# Patient Record
Sex: Female | Born: 1950 | Race: White | Hispanic: No | Marital: Married | Smoking: Never smoker
Health system: Southern US, Community
[De-identification: ages and names within clinical notes are randomized; demographics above are authoritative.]

## PROBLEM LIST (undated history)

## (undated) VITALS — BP 102/69 | HR 88 | Temp 97.2°F | Resp 18 | Wt 132.0 lb

## (undated) VITALS — BP 121/78 | HR 67 | Temp 98.2°F | Resp 18 | Wt 144.3 lb

## (undated) DIAGNOSIS — R058 Other specified cough: Secondary | ICD-10-CM

## (undated) DIAGNOSIS — K8020 Calculus of gallbladder without cholecystitis without obstruction: Secondary | ICD-10-CM

## (undated) DIAGNOSIS — E785 Hyperlipidemia, unspecified: Secondary | ICD-10-CM

## (undated) HISTORY — DX: Calculus of gallbladder without cholecystitis without obstruction: K80.20

## (undated) HISTORY — DX: Other specified cough: R05.8

## (undated) HISTORY — DX: Hyperlipidemia, unspecified: E78.5

## (undated) HISTORY — PX: COLONOSCOPY, DIAGNOSTIC (SCREENING): SHX174

---
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
## 1986-09-28 HISTORY — PX: TUBAL LIGATION: SHX77

## 1988-09-28 HISTORY — PX: HYSTERECTOMY: SHX81

## 2013-06-19 ENCOUNTER — Other Ambulatory Visit: Payer: Self-pay

## 2013-06-19 ENCOUNTER — Ambulatory Visit
Admission: RE | Admit: 2013-06-19 | Discharge: 2013-06-19 | Disposition: A | Discharge status: Home / Self Care | Payer: BC Managed Care – PPO | Source: Ambulatory Visit

## 2013-06-19 DIAGNOSIS — R109 Unspecified abdominal pain: Secondary | ICD-10-CM | POA: Insufficient documentation

## 2013-06-19 DIAGNOSIS — K838 Other specified diseases of biliary tract: Secondary | ICD-10-CM | POA: Insufficient documentation

## 2013-06-19 DIAGNOSIS — R7989 Other specified abnormal findings of blood chemistry: Secondary | ICD-10-CM | POA: Insufficient documentation

## 2013-06-19 DIAGNOSIS — R142 Eructation: Secondary | ICD-10-CM | POA: Insufficient documentation

## 2014-11-02 ENCOUNTER — Other Ambulatory Visit (FREE_STANDING_LABORATORY_FACILITY): Payer: No Typology Code available for payment source

## 2014-11-02 DIAGNOSIS — R1011 Right upper quadrant pain: Secondary | ICD-10-CM

## 2014-11-02 LAB — CBC AND DIFFERENTIAL
Basophils Absolute Automated: 0.04 10*3/uL (ref 0.00–0.20)
Basophils Automated: 1 %
Eosinophils Absolute Automated: 0.07 10*3/uL (ref 0.00–0.70)
Eosinophils Automated: 2 %
Hematocrit: 42.3 % (ref 37.0–47.0)
Hgb: 14 g/dL (ref 12.0–16.0)
Immature Granulocytes Absolute: 0 10*3/uL
Immature Granulocytes: 0 %
Lymphocytes Absolute Automated: 1.43 10*3/uL (ref 0.50–4.40)
Lymphocytes Automated: 31 %
MCH: 30.2 pg (ref 28.0–32.0)
MCHC: 33.1 g/dL (ref 32.0–36.0)
MCV: 91.4 fL (ref 80.0–100.0)
MPV: 11.5 fL (ref 9.4–12.3)
Monocytes Absolute Automated: 0.36 10*3/uL (ref 0.00–1.20)
Monocytes: 8 %
Neutrophils Absolute: 2.72 10*3/uL (ref 1.80–8.10)
Neutrophils: 59 %
Nucleated RBC: 0 /100 WBC (ref 0–1)
Platelets: 256 10*3/uL (ref 140–400)
RBC: 4.63 10*6/uL (ref 4.20–5.40)
RDW: 14 % (ref 12–15)
WBC: 4.62 10*3/uL (ref 3.50–10.80)

## 2014-11-02 LAB — COMPREHENSIVE METABOLIC PANEL
ALT: 24 U/L (ref 0–55)
AST (SGOT): 20 U/L (ref 5–34)
Albumin/Globulin Ratio: 1.5 (ref 0.9–2.2)
Albumin: 4.2 g/dL (ref 3.5–5.0)
Alkaline Phosphatase: 76 U/L (ref 37–106)
BUN: 9 mg/dL (ref 7.0–19.0)
Bilirubin, Total: 0.5 mg/dL (ref 0.1–1.2)
CO2: 27 mEq/L (ref 21–30)
Calcium: 9.2 mg/dL (ref 8.5–10.5)
Chloride: 105 mEq/L (ref 100–111)
Creatinine: 0.8 mg/dL (ref 0.4–1.5)
Globulin: 2.8 g/dL (ref 2.0–3.7)
Glucose: 83 mg/dL (ref 70–100)
Potassium: 4.6 mEq/L (ref 3.5–5.3)
Protein, Total: 7 g/dL (ref 6.0–8.3)
Sodium: 141 mEq/L (ref 135–146)

## 2014-11-02 LAB — GFR: EGFR: >60

## 2014-11-02 LAB — HEMOLYSIS INDEX: Hemolysis Index: 7 (ref 0–18)

## 2014-11-02 LAB — AMYLASE: Amylase: 66 U/L (ref 25–125)

## 2014-11-02 LAB — LIPASE: Lipase: 28 U/L (ref 8–78)

## 2014-11-07 ENCOUNTER — Other Ambulatory Visit: Payer: Self-pay | Admitting: Gastroenterology

## 2014-11-07 DIAGNOSIS — K8021 Calculus of gallbladder without cholecystitis with obstruction: Secondary | ICD-10-CM

## 2014-11-13 ENCOUNTER — Ambulatory Visit
Admission: RE | Admit: 2014-11-13 | Discharge: 2014-11-13 | Disposition: A | Discharge status: Home / Self Care | Payer: No Typology Code available for payment source | Source: Ambulatory Visit | Attending: Gastroenterology | Admitting: Gastroenterology

## 2014-11-13 DIAGNOSIS — R1011 Right upper quadrant pain: Secondary | ICD-10-CM | POA: Insufficient documentation

## 2014-11-13 DIAGNOSIS — K760 Fatty (change of) liver, not elsewhere classified: Secondary | ICD-10-CM | POA: Insufficient documentation

## 2014-11-13 DIAGNOSIS — K8080 Other cholelithiasis without obstruction: Secondary | ICD-10-CM | POA: Insufficient documentation

## 2014-11-13 DIAGNOSIS — K8021 Calculus of gallbladder without cholecystitis with obstruction: Secondary | ICD-10-CM

## 2014-11-13 DIAGNOSIS — R160 Hepatomegaly, not elsewhere classified: Secondary | ICD-10-CM | POA: Insufficient documentation

## 2014-11-13 MED ORDER — GADOBUTROL 1 MMOL/ML IV SOLN
7.0000 mL | Freq: Once | INTRAVENOUS | Status: AC | PRN
Start: 2014-11-13 — End: 2014-11-13
  Administered 2014-11-13: 7 mmol via INTRAVENOUS

## 2014-12-06 ENCOUNTER — Ambulatory Visit: Payer: No Typology Code available for payment source

## 2014-12-10 ENCOUNTER — Ambulatory Visit: Payer: No Typology Code available for payment source | Admitting: Radiology

## 2014-12-10 ENCOUNTER — Ambulatory Visit
Admission: RE | Admit: 2014-12-10 | Discharge: 2014-12-10 | Disposition: A | Discharge status: Home / Self Care | Payer: No Typology Code available for payment source | Source: Ambulatory Visit | Attending: Surgery | Admitting: Surgery

## 2014-12-10 ENCOUNTER — Ambulatory Visit: Payer: No Typology Code available for payment source

## 2014-12-10 ENCOUNTER — Encounter
Admission: RE | Disposition: A | Discharge status: Home / Self Care | Payer: Self-pay | Source: Ambulatory Visit | Attending: Surgery

## 2014-12-10 ENCOUNTER — Ambulatory Visit: Payer: Self-pay

## 2014-12-10 ENCOUNTER — Ambulatory Visit: Payer: No Typology Code available for payment source | Admitting: Surgery

## 2014-12-10 DIAGNOSIS — K8010 Calculus of gallbladder with chronic cholecystitis without obstruction: Secondary | ICD-10-CM | POA: Insufficient documentation

## 2014-12-10 DIAGNOSIS — K219 Gastro-esophageal reflux disease without esophagitis: Secondary | ICD-10-CM | POA: Insufficient documentation

## 2014-12-10 DIAGNOSIS — K838 Other specified diseases of biliary tract: Secondary | ICD-10-CM | POA: Insufficient documentation

## 2014-12-10 HISTORY — PX: LAPAROSCOPIC, CHOLECYSTECTOMY, CHOLANGIOGRAM: SHX4475

## 2014-12-10 HISTORY — DX: Calculus of gallbladder without cholecystitis without obstruction: K80.20

## 2014-12-10 SURGERY — LAPAROSCOPIC, CHOLECYSTECTOMY, CHOLANGIOGRAM
Anesthesia: Anesthesia General | Site: Abdomen | Wound class: Clean Contaminated

## 2014-12-10 MED ORDER — ONDANSETRON HCL 4 MG/2ML IJ SOLN
INTRAMUSCULAR | Status: AC
Start: 2014-12-10 — End: ?
  Filled 2014-12-10: qty 2

## 2014-12-10 MED ORDER — LACTATED RINGERS IV SOLN
INTRAVENOUS | Status: DC | PRN
Start: 2014-12-10 — End: 2014-12-10

## 2014-12-10 MED ORDER — NEOSTIGMINE METHYLSULFATE 1 MG/ML IJ SOLN
INTRAMUSCULAR | Status: AC
Start: 2014-12-10 — End: ?
  Filled 2014-12-10: qty 10

## 2014-12-10 MED ORDER — LACTATED RINGERS IV SOLN
INTRAVENOUS | Status: DC
Start: 2014-12-10 — End: 2014-12-10

## 2014-12-10 MED ORDER — IOTHALAMATE MEGLUMINE 60 % IJ SOLN
INTRAMUSCULAR | Status: AC
Start: 2014-12-10 — End: ?
  Filled 2014-12-10: qty 50

## 2014-12-10 MED ORDER — SODIUM CHLORIDE 0.9 % IV SOLN
INTRAVENOUS | Status: AC
Start: 2014-12-10 — End: ?
  Filled 2014-12-10: qty 100

## 2014-12-10 MED ORDER — BUPIVACAINE HCL 0.5 % IJ SOLN
INTRAMUSCULAR | Status: DC | PRN
Start: 2014-12-10 — End: 2014-12-10
  Administered 2014-12-10: 10 mL

## 2014-12-10 MED ORDER — PROPOFOL INFUSION 10 MG/ML
INTRAVENOUS | Status: DC | PRN
Start: 2014-12-10 — End: 2014-12-10
  Administered 2014-12-10: 160 mg via INTRAVENOUS

## 2014-12-10 MED ORDER — EPHEDRINE SULFATE 50 MG/ML IJ SOLN
INTRAMUSCULAR | Status: AC
Start: 2014-12-10 — End: ?
  Filled 2014-12-10: qty 1

## 2014-12-10 MED ORDER — SODIUM CHLORIDE 0.9 % IV MBP
1.0000 g | Freq: Three times a day (TID) | INTRAVENOUS | Status: DC
Start: 2014-12-10 — End: 2014-12-10

## 2014-12-10 MED ORDER — SODIUM CHLORIDE BACTERIOSTATIC 0.9 % IJ SOLN
INTRAMUSCULAR | Status: AC
Start: 2014-12-10 — End: ?
  Filled 2014-12-10: qty 30

## 2014-12-10 MED ORDER — NEOSTIGMINE METHYLSULFATE 1 MG/ML IJ SOLN
INTRAMUSCULAR | Status: DC | PRN
Start: 2014-12-10 — End: 2014-12-10
  Administered 2014-12-10: 3 mg via INTRAVENOUS

## 2014-12-10 MED ORDER — MIDAZOLAM HCL 2 MG/2ML IJ SOLN
INTRAMUSCULAR | Status: DC | PRN
Start: 2014-12-10 — End: 2014-12-10
  Administered 2014-12-10: 2 mg via INTRAVENOUS

## 2014-12-10 MED ORDER — SODIUM CHLORIDE 0.9 % IJ SOLN
INTRAMUSCULAR | Status: AC
Start: 2014-12-10 — End: ?
  Filled 2014-12-10: qty 10

## 2014-12-10 MED ORDER — OXYCODONE-ACETAMINOPHEN 5-325 MG PO TABS
1.0000 | ORAL_TABLET | ORAL | Status: DC | PRN
Start: 2014-12-10 — End: 2018-01-13

## 2014-12-10 MED ORDER — EPHEDRINE SULFATE 50 MG/ML IJ SOLN
INTRAMUSCULAR | Status: DC | PRN
Start: 2014-12-10 — End: 2014-12-10
  Administered 2014-12-10 (×2): 10 mg via INTRAVENOUS

## 2014-12-10 MED ORDER — PROPOFOL 10 MG/ML IV EMUL
INTRAVENOUS | Status: AC
Start: 2014-12-10 — End: ?
  Filled 2014-12-10: qty 20

## 2014-12-10 MED ORDER — FENTANYL CITRATE 0.05 MG/ML IJ SOLN
25.0000 ug | INTRAMUSCULAR | Status: DC | PRN
Start: 2014-12-10 — End: 2014-12-10
  Administered 2014-12-10: 25 ug via INTRAVENOUS

## 2014-12-10 MED ORDER — BUPIVACAINE HCL (PF) 0.5 % IJ SOLN
INTRAMUSCULAR | Status: AC
Start: 2014-12-10 — End: ?
  Filled 2014-12-10: qty 30

## 2014-12-10 MED ORDER — HYDROMORPHONE HCL 1 MG/ML IJ SOLN
0.4000 mg | INTRAMUSCULAR | Status: DC | PRN
Start: 2014-12-10 — End: 2014-12-10

## 2014-12-10 MED ORDER — SUCCINYLCHOLINE CHLORIDE 20 MG/ML IJ SOLN
INTRAMUSCULAR | Status: AC
Start: 2014-12-10 — End: ?
  Filled 2014-12-10: qty 10

## 2014-12-10 MED ORDER — ROCURONIUM BROMIDE 50 MG/5ML IV SOLN
INTRAVENOUS | Status: DC | PRN
Start: 2014-12-10 — End: 2014-12-10
  Administered 2014-12-10: 5 mg via INTRAVENOUS
  Administered 2014-12-10: 40 mg via INTRAVENOUS

## 2014-12-10 MED ORDER — SEVOFLURANE IN SOLN
RESPIRATORY_TRACT | Status: AC
Start: 2014-12-10 — End: ?
  Filled 2014-12-10: qty 250

## 2014-12-10 MED ORDER — SODIUM CHLORIDE 0.9% BAG (IRRIGATION USE)
INTRAVENOUS | Status: DC | PRN
Start: 2014-12-10 — End: 2014-12-10
  Administered 2014-12-10: 100 mL

## 2014-12-10 MED ORDER — ONDANSETRON HCL 4 MG/2ML IJ SOLN
INTRAMUSCULAR | Status: DC | PRN
Start: 2014-12-10 — End: 2014-12-10
  Administered 2014-12-10: 4 mg via INTRAVENOUS

## 2014-12-10 MED ORDER — PROMETHAZINE HCL 25 MG/ML IJ SOLN
6.2500 mg | Freq: Once | INTRAMUSCULAR | Status: DC | PRN
Start: 2014-12-10 — End: 2014-12-10

## 2014-12-10 MED ORDER — DEXAMETHASONE SOD PHOSPHATE PF 10 MG/ML IJ SOLN
INTRAMUSCULAR | Status: AC
Start: 2014-12-10 — End: ?
  Filled 2014-12-10: qty 1

## 2014-12-10 MED ORDER — LIDOCAINE HCL 2 % IJ SOLN
INTRAMUSCULAR | Status: DC | PRN
Start: 2014-12-10 — End: 2014-12-10
  Administered 2014-12-10: 80 mg

## 2014-12-10 MED ORDER — OXYCODONE-ACETAMINOPHEN 5-325 MG PO TABS
ORAL_TABLET | ORAL | Status: AC
Start: 2014-12-10 — End: ?
  Filled 2014-12-10: qty 2

## 2014-12-10 MED ORDER — GLYCOPYRROLATE 0.2 MG/ML IJ SOLN
INTRAMUSCULAR | Status: DC | PRN
Start: 2014-12-10 — End: 2014-12-10
  Administered 2014-12-10: .6 mg via INTRAVENOUS

## 2014-12-10 MED ORDER — CEFAZOLIN SODIUM 1 G IJ SOLR
INTRAMUSCULAR | Status: AC
Start: 2014-12-10 — End: ?
  Filled 2014-12-10: qty 1000

## 2014-12-10 MED ORDER — LIDOCAINE HCL (PF) 2 % IJ SOLN
INTRAMUSCULAR | Status: AC
Start: 2014-12-10 — End: ?
  Filled 2014-12-10: qty 5

## 2014-12-10 MED ORDER — FENTANYL CITRATE 0.05 MG/ML IJ SOLN
INTRAMUSCULAR | Status: AC
Start: 2014-12-10 — End: ?
  Filled 2014-12-10: qty 2

## 2014-12-10 MED ORDER — ROCURONIUM BROMIDE 50 MG/5ML IV SOLN
INTRAVENOUS | Status: AC
Start: 2014-12-10 — End: ?
  Filled 2014-12-10: qty 5

## 2014-12-10 MED ORDER — HYDROMORPHONE HCL 1 MG/ML IJ SOLN
INTRAMUSCULAR | Status: AC
Start: 2014-12-10 — End: ?
  Filled 2014-12-10: qty 1

## 2014-12-10 MED ORDER — SODIUM CHLORIDE 0.9 % IV MBP
1.0000 g | Freq: Once | INTRAVENOUS | Status: AC
Start: 2014-12-10 — End: 2014-12-10
  Administered 2014-12-10: 1 g via INTRAVENOUS

## 2014-12-10 MED ORDER — GLYCOPYRROLATE 0.2 MG/ML IJ SOLN
INTRAMUSCULAR | Status: AC
Start: 2014-12-10 — End: ?
  Filled 2014-12-10: qty 1

## 2014-12-10 MED ORDER — GLYCOPYRROLATE 0.2 MG/ML IJ SOLN
INTRAMUSCULAR | Status: AC
Start: 2014-12-10 — End: ?
  Filled 2014-12-10: qty 3

## 2014-12-10 MED ORDER — DEXAMETHASONE SODIUM PHOSPHATE 4 MG/ML IJ SOLN (WRAP)
INTRAMUSCULAR | Status: DC | PRN
Start: 2014-12-10 — End: 2014-12-10
  Administered 2014-12-10: 10 mg via INTRAVENOUS

## 2014-12-10 MED ORDER — OXYCODONE-ACETAMINOPHEN 5-325 MG PO TABS
2.0000 | ORAL_TABLET | Freq: Once | ORAL | Status: AC | PRN
Start: 2014-12-10 — End: 2014-12-10
  Administered 2014-12-10: 2 via ORAL

## 2014-12-10 MED ORDER — FENTANYL CITRATE 0.05 MG/ML IJ SOLN
INTRAMUSCULAR | Status: DC | PRN
Start: 2014-12-10 — End: 2014-12-10
  Administered 2014-12-10: 100 ug via INTRAVENOUS

## 2014-12-10 MED ORDER — MIDAZOLAM HCL 2 MG/2ML IJ SOLN
INTRAMUSCULAR | Status: AC
Start: 2014-12-10 — End: ?
  Filled 2014-12-10: qty 2

## 2014-12-10 MED ORDER — FENTANYL CITRATE 0.05 MG/ML IJ SOLN
INTRAMUSCULAR | Status: AC
Start: 2014-12-10 — End: 2014-12-10
  Administered 2014-12-10: 25 ug via INTRAVENOUS
  Filled 2014-12-10: qty 2

## 2014-12-10 MED ORDER — ONDANSETRON HCL 4 MG/2ML IJ SOLN
4.0000 mg | Freq: Once | INTRAMUSCULAR | Status: DC | PRN
Start: 2014-12-10 — End: 2014-12-10

## 2014-12-10 MED ORDER — HYDROMORPHONE HCL 1 MG/ML IJ SOLN
INTRAMUSCULAR | Status: DC | PRN
Start: 2014-12-10 — End: 2014-12-10
  Administered 2014-12-10: 0.5 mg via INTRAVENOUS

## 2014-12-10 SURGICAL SUPPLY — 55 items
APPLIER ENDOCLIP SNGL 5MM (Staplers) ×1
APPLIER INTERNAL CLIP MEDIUM LARGE L33 (Staplers) ×1
APPLIER INTERNAL CLIP MEDIUM LARGE L33 CM TITANIUM PISTOL GRIP GLARE (Staplers) ×1 IMPLANT
BANDAGE STERI-STRIP 0.5X4IN (Dressing) ×1
CABLE HIGH FREQUENCY MONOPOLAR (Cautery) ×1 IMPLANT
CANULA STABILITY 5MM (Procedure Accessories) ×4 IMPLANT
CATH CHOLANGIOGRAM 4.5X18IN (Procedure Accessories) ×1 IMPLANT
DISSECTOR ENDOSCOPIC L38 CM 1 TIP RADIOPAQUE BRUSH FINISH BLUNT OD5 MM (Sponge) IMPLANT
DISSECTOR ESCP SS KTNR 5MM 38CM LF STRL (Sponge)
DRAPE 74X41IN UNIVERSAL POLY XRAY C ARM CLOSURE STRAP (Drape) IMPLANT
DRAPE EQP VLCR POLY UNV STRDRP 74X41IN (Drape) ×1
DRAPE XRAY C-ARM 27X17X70 (Drape) ×1
GLOVE SURG BIOGEL INDIC SZ 7.5 (Glove) ×2 IMPLANT
GLOVE SURG BIOGEL PF LTX SZ7.0 (Glove) ×2 IMPLANT
HOOK ELECTRODES 36MM (Respiratory Supplies) ×2 IMPLANT
INTRO TAUT CHOLANGIO 8F (Procedure Accessories) ×1
INTRODUCER CATHETER L3.5 IN ID8 FR (Procedure Accessories) ×1
INTRODUCER CATHETER L3.5 IN ID8 FR PERITONEAL TAUT (Procedure Accessories) IMPLANT
KIT ADV LAPSCPC CARE (Kits)
LIGATOR L18 IN ENDOLOOP MONOFILAMENT TIE (Suture) ×1
LIGATOR L18 IN ENDOLOOP MONOFILAMENT TIE LOOP SUTURE ENDOSCOPIC PDS II (Suture) IMPLANT
PACK GEN LAPAROSCOPY FFX (Pack) ×2 IMPLANT
PAD ELECTROSRG GRND REM W CRD (Procedure Accessories) ×2 IMPLANT
PATCH COVERLET 2X3 (Dressing) ×5 IMPLANT
POUCH ENDO CATCH 10MM GOLD (Laparoscopy Supplies) ×1
POUCH SPECIMEN RETRIEVAL L34.5 CM (Laparoscopy Supplies) ×1
POUCH SPECIMEN RETRIEVAL L34.5 CM ERGONOMIC HANDLE LONG CYLINDRICAL (Laparoscopy Supplies) IMPLANT
RELOAD MEDIUM THICK 45MM (Staplers)
RELOAD STAPLER 3 MM 3.5 MM 4 MM L45 MM (Staplers)
RELOAD STAPLER 3 MM 3.5 MM 4 MM L45 MM ENDO GIA TITANIUM MEDIUM THICK (Staplers) IMPLANT
SOL NACL .9% IRRIG 500ML (Irrigation Solutions) ×1
SOLUTION IRRIGATION 0.9% SDM CHLORIDE 500ML PR BTTL ISOTONIC NONPRGNC (Irrigation Solutions) ×1 IMPLANT
SOLUTION IRRIGATION 0.9% SODIUM CHLORIDE (Irrigation Solutions) ×1
SPONGE CHLRPRP TINT 26ML (Applicator) ×2 IMPLANT
SPONGE GZE STR 2'S 8PLY 2X2 (Dressing) ×1 IMPLANT
SPONGE KITTNER ENDOSCOPIC (Sponge)
STAPLER ENDOGIA ULTRA UNI STD (Staplers)
STAPLER TISSUE L16 CM X W4 MM OD12 MM (Staplers)
STAPLER TISSUE L16 CM X W4 MM OD12 MM ENDOSCOPIC ENDO GIA PVC INTERNAL (Staplers) IMPLANT
STRIP SKIN CLOSURE L4 IN X W1/2 IN (Dressing) ×1
STRIP SKIN CLOSURE L4 IN X W1/2 IN REINFORCE STERI-STRIP POLYESTER (Dressing) ×1 IMPLANT
SUCTION IRRIGATOR 2 WITH TIP (Suction) ×1 IMPLANT
SUTURE COATED VICRYL 0 L18 IN BRAID TIES (Suture) ×1
SUTURE COATED VICRYL 0 L18 IN BRAID TIES 6 STRAND COATED VIOLET (Suture) ×1 IMPLANT
SUTURE MONOCRYL 4-0 PS2 27IN (Suture) ×3 IMPLANT
SUTURE PDS II ENDOLOOPI 0 18IN (Suture) ×1
SUTURE VICRYL 0 6X18IN (Suture) ×1
SUTURE VICRYL 0 UR6 27IN (Suture) ×4 IMPLANT
SYSTEM IMAGING 8X6IN CLEARIFY MICROFIBER WARM HUB TRCR WIPE DSPSBL (Kits) IMPLANT
SYSTEM IMG MRFBR CLEARIFY 8X6IN WRM HUB (Kits)
TROCAR BLADELESS ENDO 12X10MM (Laparoscopy Supplies) IMPLANT
TROCAR BLADELESS ENDO 5X100MM (Laparoscopy Supplies) ×2 IMPLANT
TROCAR XCEL ENDO TIP 12MM (Laparoscopy Supplies) ×2 IMPLANT
TUBING INSUFFLATION LUER CONNECTOR FILTER HIGH FLOW CLEANFLOW SU1101 (Respiratory Supplies) ×1 IMPLANT
TUBING INSUFFLATION W/CO2 GUAR (Respiratory Supplies) ×2

## 2014-12-10 NOTE — H&P (Signed)
ADMISSION HISTORY AND PHYSICAL EXAM    Date Time: 12/10/2014 8:25 AM  Patient Name: Jill Ross  Surgical Attending: Rickey Primus, MD     History of Present Illness:   Jill Ross is a 64 y.o. female who presents to the hospital with sx gs    Past Medical History:     Past Medical History   Diagnosis Date   . Gastroesophageal reflux disease    . Hepatitis      A   . Gall stones        Past Surgical History:     Past Surgical History   Procedure Laterality Date   . Hysterectomy     . Tubal ligation     . Colonoscopy         Family History:   History reviewed. No pertinent family history.    Social History:     History     Social History   . Marital Status: Married     Spouse Name: N/A     Number of Children: N/A   . Years of Education: N/A     Social History Main Topics   . Smoking status: Never Smoker    . Smokeless tobacco: Never Used   . Alcohol Use: Yes      Comment: occ.   . Drug Use: No   . Sexual Activity: Not on file     Other Topics Concern   . Not on file     Social History Narrative   . No narrative on file       Allergies:   No Known Allergies    Medications:     Prescriptions prior to admission   Medication Sig   . aspirin EC 81 MG EC tablet Take 81 mg by mouth daily.   . Cholecalciferol (VITAMIN D PO) Take by mouth.       Review of Systems:   A comprehensive review of systems was: History obtained from the patient  General ROS: Negative  Respiratory ROS: no cough, shortness of breath, or wheezing  Cardiovascular ROS: no chest pain or dyspnea on exertion  Gastrointestinal ROS: no abdominal pain, change in bowel habits, or black or bloody stools  Genito-Urinary ROS: no dysuria, trouble voiding, or hematuria  Dermatological ROS: negative    Physical Exam:     Filed Vitals:    12/10/14 0745   BP: 120/70   Pulse: 65   Temp: 97.1 F (36.2 C)   Resp: 18   SpO2: 94%       Intake and Output Summary (Last 24 hours) at Date Time  No intake or output data in the 24 hours ending 12/10/14  0825    Physical Exam:     General: awake, alert, oriented x 3; no acute distress.  Neck: supple, no lymphadenopathy, no thyromegaly  Cardiovascular: regular rate and rhythm, no murmurs, rubs or gallops  Lungs: clear to auscultation bilaterally, without wheezing, rhonchi, or rales  Abdomen: soft, non-tender, non-distended; no palpable masses, no hepatosplenomegaly, normoactive bowel sounds, no rebound or guarding; incision clean, dry and intact.    Skin: no rashes or lesions noted  Extremities: OK    Labs:     Results     ** No results found for the last 24 hours. **          Rads:     Radiology Results (24 Hour)     ** No results found for the last 24 hours. **  Additional Studies:         Radiological Procedure reviewed.    Assessment:   Sx gs    Plan:   Lap chole with ioc      Signed by: Rickey Primus, MD  12/10/2014 8:25 AM

## 2014-12-10 NOTE — OR Nursing (Signed)
Conray 60% 15ml injected for cholangiogram by Dr Elmon Else intraoperative.

## 2014-12-10 NOTE — Anesthesia Preprocedure Evaluation (Addendum)
Anesthesia Evaluation    AIRWAY    Mallampati: II    TM distance: >3 FB  Neck ROM: full  Mouth Opening:full   CARDIOVASCULAR    cardiovascular exam normal, regular and normal       DENTAL    no notable dental hx     PULMONARY    pulmonary exam normal and clear to auscultation     OTHER FINDINGS                      Anesthesia Plan    ASA 2     general                     intravenous induction               informed consent obtained

## 2014-12-10 NOTE — Brief Op Note (Signed)
BRIEF OP NOTE    Date Time: 12/10/2014 12:16 PM    Patient Name:   Jill Ross    Date of Operation:   12/10/2014    Providers Performing:   Surgeon(s):  Rickey Primus, MD    Assistant (s):   Circulator: Tomasa Hose, RN  Scrub Person: Staci Acosta  First Assistant: Lynnda Shields    Operative Procedure:   Procedure(s):  LAPAROSCOPIC CHOLECYSTECTOMY WITH CHOLANGIOGRAM    Preoperative Diagnosis:   Pre-Op Diagnosis Codes:     * Gallstones [K80.20]    Postoperative Diagnosis:   * No post-op diagnosis entered *    Anesthesia:   General    Estimated Blood Loss:   Minimal.    Implants:   * No implants in log *    Specimens:        SPECIMENS (last 24 hours)      Pathology Specimens       12/10/14 0900             Specimen Information    Specimen Testing Required Routine Pathology       Specimen ID  [id number]       Specimen Description Gallbladder           Findings:   inflammed gb and large cystic duct and endoloop and ioc    Complications:   None.      Signed by: Rickey Primus, MD                                                                           [facility]

## 2014-12-10 NOTE — Anesthesia Postprocedure Evaluation (Signed)
Anesthesia Post Evaluation    Patient: Jill Ross    Procedures performed: Procedure(s):  LAPAROSCOPIC CHOLECYSTECTOMY WITH CHOLANGIOGRAM    Anesthesia type: General ETT    Patient location:Phase II PACU    Last vitals:   Filed Vitals:    12/10/14 1108   BP: 116/86   Pulse: 64   Temp: 36.2 C (97.2 F)   Resp: 16   SpO2: 96%       Post pain: Patient not complaining of pain, continue current therapy      Mental Status:awake and alert     Respiratory Function: tolerating room air    Cardiovascular: stable    Nausea/Vomiting: patient not complaining of nausea or vomiting    Hydration Status: adequate    Post assessment: no apparent anesthetic complications

## 2014-12-10 NOTE — Transfer of Care (Signed)
Anesthesia Transfer of Care Note    Patient: Jill Ross    Procedures performed: Procedure(s):  LAPAROSCOPIC CHOLECYSTECTOMY WITH CHOLANGIOGRAM    Anesthesia type: General ETT    Patient location:Phase I PACU    Last vitals:   Filed Vitals:    12/10/14 1005   BP: 131/67   Pulse: 75   Temp: 36.1 C (97 F)   Resp: 10   SpO2: 97%       Post pain: Patient not complaining of pain, continue current therapy      Mental Status:sedated    Respiratory Function: tolerating nasal cannula    Cardiovascular: stable    Nausea/Vomiting: patient not complaining of nausea or vomiting    Hydration Status: adequate    Post assessment: no apparent anesthetic complications, no reportable events and no evidence of recall

## 2014-12-10 NOTE — Discharge Instructions (Addendum)
DR. STEFANO AGOLINI  [address]  [place], [place]  [address]  [phone]    Post Operative gallbladder instructions     Remove the outside dressing 24 hours after surgery.  There is no need to apply a new dressing unless there is drainage.  Leave the steri-strips (little pieces of white tape) on the incisions.  If blisters develop, then remove the strips right away.  If you have stitches these will be removed in the office.  You may shower over the incisions 24 hours after the surgery.  You may go up and down stairs, and walk outside.  You should not drive a car until totally mobile and off all painkillers.  Use the painkillers as directed but once the pain starts to diminish then switch to Advil or Tylenol.  If antibiotics have been prescribed, complete the full course.  Avoid lifting more than 20 lbs.  Your activity level will be determined at your next office visit.  Please call the office at [phone] to make an appointment 1-3 weeks from the time of surgery.  The signs of a potentially serious problem may include: a temperature greater than 102 degrees, increasing abdominal pain, significant swelling, inability to urinate, persistent nausea, vomiting and or diarrhea, leg swelling, redness or significant drainage from the incision.  If you develop any of these symptoms or are concerned about other issues call, without delay [phone].  If there is no response, go to the nearest emergency room.    There are no dietary restrictions but avoid particularly large or spicy meals. Avoid alcohol until seen in the office.       Post Anesthesia Discharge Instructions    Although you may be awake and alert in the recovery room, small amounts of anesthetic remain in your system for about 24 hours.  You may feel tired and sleepy during this time.      You are advised to go directly home from the hospital.    Plan to stay at home and rest for the remainder of the day.    It is advisable to have  someone with you at home for 24 hours after surgery.    Do not operate a motor vehicle, or any mechanical or electrical equipment for the next 24 hours.      Be careful when you are walking around, you may become dizzy.  The effects of anesthesia and/or medications are still present and drowsiness may occur    Do not consume alcohol, tranquilizers, sleeping medications, or any other non prescribed medication for the remainder of the day.    Diet:  begin with liquids, progress your diet as tolerated or as directed by your surgeon.  Nausea and vomiting may occur in the next 24 hours.

## 2014-12-11 ENCOUNTER — Encounter: Payer: Self-pay | Admitting: Surgery

## 2014-12-12 LAB — LAB USE ONLY - HISTORICAL SURGICAL PATHOLOGY

## 2014-12-12 NOTE — Op Note (Signed)
Procedure Date: 12/10/2014     Patient Type: A     SURGEON: Rickey Primus MD  ASSISTANT:       PREOPERATIVE DIAGNOSES:  Symptomatic gallstones and dilated common bile duct.     POSTOPERATIVE DIAGNOSES:  Symptomatic gallstones and dilated common bile duct.     TITLE OF PROCEDURE:  Laparoscopic cholecystectomy with intraoperative cholangiogram.     ANESTHESIA:  General.     INDICATIONS FOR PROCEDURE:  This is a female with likely symptomatic cholelithiasis.  She also was  noted to have a dilated common bile duct with normal liver function tests.   She had a preoperative MRCP which showed a dilated duct, but no filling  defects.  Surgery was advised.  We discussed the use of a cholangiogram  with generalized risk of bleeding, infection, damage to bile duct, damage  to intestine, need for an open procedure were all discussed and she wished  to proceed.     DESCRIPTION OF PROCEDURE:  The patient was identified.  General anesthesia was administered.  She was  prepped and draped in a standard fashion.  She had pneumatic compression  devices on the lower extremities.  She was given IV antibiotics.  An  incision was made above the umbilicus in anticipation of a cutdown  technique; however, it was noted that slightly to the left of midline it  was almost as if there was a defect in the abdominal wall.  In any case,  entry was gained atraumatically and a Hasson trocar was introduced and a  30-degree scope was used.  A 5-mm port was placed subxiphoid and two 5-mm  ports placed in the right upper quadrant.  The patient had evidence of  chronic, relatively severe cholecystitis.  The gallbladder was not that  easy to grasp.  There were adhesions to the gallbladder.  These were taken  down using cautery, staying close to the gallbladder and avoiding injury to  the bowel.  There were a fair number of adhesions.       Eventually, the neck of the gallbladder was identified and, again, there  was evidence of chronic cholecystitis.   Through careful dissection, the  junction of the gallbladder and the cystic duct was defined laterally,  posteriorly, and medially.  The cystic artery was dissected free.  The  gallbladder was taken off the liver bed using electrocautery to further  define the junction of the gallbladder and the cystic duct.  The cystic  duct was large in nature.  The common bile duct externally appeared large.   This was somewhat expected based on our preoperative information.  It now  appears as if we had obtained a critical view of the liver edge, cystic  artery, and cystic duct.  The cystic artery was clipped and then due to  this dilated duct an intraoperative cholangiogram was performed and this  showed flow going up into the right and left hepatic ducts, common hepatic  duct, common bile duct.  These were all quite dilated, if not very dilated  structures.       Initially, it was slowed to empty into the duodenum, but there was eventual  filling of the duodenum through a small or narrow sphincter of Oddi.  There  did not appear to be any actual filling defect.  It was not obvious that  there was any tumor or other actual issues in this area.  Of note, I did  review the films with the radiologist after  the surgery and the radiologist  felt it could not be diagnostic as to why this duct was dilated, but a  question of a distal stricture or papillary stenosis was entertained.       In any case, the cystic duct was then secured with an Endoloop and an  additional clip.  The gallbladder was taken off the liver bed using  electrocautery and placed into a bag and removed at the umbilicus.   Hemostasis was assured.  Then, when I went to close the umbilical defect,  it appeared to [place] that this was actually a relatively wide-mouthed defect  in this area.  We placed  laparoscopic 0 Vicryl figure-of-eight sutures.  This appeared to close the  area reasonably well.    The 5-mm ports were removed laterally under visualization.  Skin was  closed  with Monocryl.  Steri-Strips were applied.  The patient was stable  throughout.           D:  12/11/2014 23:15 PM by Dr. Carole Binning. Elmon Else, MD [phone])  T:  12/12/2014 12:07 PM by       Everlean Cherry: [id number]) (Doc ID: [id number])

## 2015-01-18 ENCOUNTER — Other Ambulatory Visit (FREE_STANDING_LABORATORY_FACILITY): Payer: No Typology Code available for payment source

## 2015-01-18 DIAGNOSIS — R1011 Right upper quadrant pain: Secondary | ICD-10-CM

## 2015-01-18 LAB — HEPATIC FUNCTION PANEL
ALT: 32 U/L (ref 0–55)
AST (SGOT): 22 U/L (ref 5–34)
Albumin/Globulin Ratio: 1.3 (ref 0.9–2.2)
Albumin: 4 g/dL (ref 3.5–5.0)
Alkaline Phosphatase: 89 U/L (ref 37–106)
Bilirubin Direct: 0.2 mg/dL (ref 0.0–0.5)
Bilirubin Indirect: 0.2 mg/dL (ref 0.0–1.0)
Bilirubin, Total: 0.4 mg/dL (ref 0.1–1.2)
Globulin: 3 g/dL (ref 2.0–3.7)
Protein, Total: 7 g/dL (ref 6.0–8.3)

## 2015-01-18 LAB — HEMOLYSIS INDEX: Hemolysis Index: 6 (ref 0–18)

## 2018-04-28 DIAGNOSIS — C801 Malignant (primary) neoplasm, unspecified: Secondary | ICD-10-CM

## 2018-04-28 HISTORY — DX: Malignant (primary) neoplasm, unspecified: C80.1

## 2018-06-07 ENCOUNTER — Ambulatory Visit: Payer: No Typology Code available for payment source

## 2018-06-07 ENCOUNTER — Other Ambulatory Visit: Payer: Self-pay

## 2018-06-07 DIAGNOSIS — R059 Cough, unspecified: Secondary | ICD-10-CM

## 2018-06-07 DIAGNOSIS — R05 Cough: Secondary | ICD-10-CM | POA: Insufficient documentation

## 2018-06-16 ENCOUNTER — Telehealth: Payer: Self-pay

## 2018-06-20 ENCOUNTER — Encounter: Payer: Self-pay | Admitting: Radiation Oncology

## 2018-06-20 ENCOUNTER — Other Ambulatory Visit: Payer: Self-pay

## 2018-06-20 ENCOUNTER — Encounter: Payer: Self-pay | Admitting: Hematology & Oncology

## 2018-06-20 ENCOUNTER — Ambulatory Visit: Payer: No Typology Code available for payment source | Admitting: Radiation Oncology

## 2018-06-20 ENCOUNTER — Other Ambulatory Visit: Payer: No Typology Code available for payment source

## 2018-06-20 ENCOUNTER — Ambulatory Visit
Admission: RE | Admit: 2018-06-20 | Discharge: 2018-06-20 | Disposition: A | Discharge status: Home / Self Care | Payer: No Typology Code available for payment source | Source: Ambulatory Visit | Attending: Hematology & Oncology | Admitting: Hematology & Oncology

## 2018-06-20 ENCOUNTER — Ambulatory Visit (INDEPENDENT_AMBULATORY_CARE_PROVIDER_SITE_OTHER): Payer: No Typology Code available for payment source | Admitting: Hematology & Oncology

## 2018-06-20 VITALS — BP 107/72 | HR 93 | Temp 98.0°F | Ht 64.0 in | Wt 145.5 lb

## 2018-06-20 DIAGNOSIS — C210 Malignant neoplasm of anus, unspecified: Secondary | ICD-10-CM

## 2018-06-20 DIAGNOSIS — Z5111 Encounter for antineoplastic chemotherapy: Secondary | ICD-10-CM

## 2018-06-20 LAB — HEPATITIS B CORE ANTIBODY, TOTAL: Hepatitis B Core Total AB: NONREACTIVE

## 2018-06-20 LAB — CBC AND DIFFERENTIAL
Absolute NRBC: 0 10*3/uL (ref 0.00–0.00)
Basophils Absolute Automated: 0.08 10*3/uL (ref 0.00–0.08)
Basophils Automated: 0.7 %
Eosinophils Absolute Automated: 0.02 10*3/uL (ref 0.00–0.44)
Eosinophils Automated: 0.2 %
Hematocrit: 35.1 % (ref 34.7–43.7)
Hgb: 11.6 g/dL (ref 11.4–14.8)
Immature Granulocytes Absolute: 0.03 10*3/uL (ref 0.00–0.07)
Immature Granulocytes: 0.3 %
Lymphocytes Absolute Automated: 0.92 10*3/uL (ref 0.42–3.22)
Lymphocytes Automated: 8.2 %
MCH: 28.4 pg (ref 25.1–33.5)
MCHC: 33 g/dL (ref 31.5–35.8)
MCV: 85.8 fL (ref 78.0–96.0)
MPV: 9.8 fL (ref 8.9–12.5)
Monocytes Absolute Automated: 0.43 10*3/uL (ref 0.21–0.85)
Monocytes: 3.8 %
Neutrophils Absolute: 9.75 10*3/uL — ABNORMAL HIGH (ref 1.10–6.33)
Neutrophils: 86.8 %
Nucleated RBC: 0 /100 WBC (ref 0.0–0.0)
Platelets: 412 10*3/uL — ABNORMAL HIGH (ref 142–346)
RBC: 4.09 10*6/uL (ref 3.90–5.10)
RDW: 14 % (ref 11–15)
WBC: 11.23 10*3/uL — ABNORMAL HIGH (ref 3.10–9.50)

## 2018-06-20 LAB — COMPREHENSIVE METABOLIC PANEL
ALT: 15 U/L (ref 0–55)
AST (SGOT): 18 U/L (ref 5–34)
Albumin/Globulin Ratio: 0.8 — ABNORMAL LOW (ref 0.9–2.2)
Albumin: 3.6 g/dL (ref 3.5–5.0)
Alkaline Phosphatase: 95 U/L (ref 37–106)
BUN: 13.2 mg/dL (ref 7.0–19.0)
Bilirubin, Total: 0.5 mg/dL (ref 0.1–1.2)
CO2: 25 mEq/L (ref 21–29)
Calcium: 9.7 mg/dL (ref 8.5–10.5)
Chloride: 102 mEq/L (ref 100–111)
Creatinine: 0.9 mg/dL (ref 0.4–1.5)
Globulin: 4.7 g/dL — ABNORMAL HIGH (ref 2.0–3.7)
Glucose: 130 mg/dL — ABNORMAL HIGH (ref 70–100)
Potassium: 4.3 mEq/L (ref 3.5–5.1)
Protein, Total: 8.3 g/dL (ref 6.0–8.3)
Sodium: 138 mEq/L (ref 136–145)

## 2018-06-20 LAB — HEPATITIS B SURFACE ANTIGEN W/ REFLEX TO CONFIRMATION: Hepatitis B Surface Antigen: NONREACTIVE

## 2018-06-20 LAB — HEPATITIS C ANTIBODY: Hepatitis C, AB: NONREACTIVE

## 2018-06-20 LAB — PT/INR
PT INR: 1.1 (ref 0.9–1.1)
PT: 13.6 s (ref 12.6–15.0)

## 2018-06-20 LAB — GFR: EGFR: >60

## 2018-06-20 LAB — APTT: PTT: 26 s (ref 23–37)

## 2018-06-20 LAB — HEMOLYSIS INDEX: Hemolysis Index: 3 (ref 0–18)

## 2018-06-20 NOTE — Telephone Encounter (Signed)
Error

## 2018-06-20 NOTE — Progress Notes (Signed)
Subjective:       No history exists.             Treatment History:      Interval History:  She notes a cough since early August.     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    She presented with one month of anal pain and pressure in August 2019. Initially, she was treated for hemorrhoids without therapeutic effect. She was seen by Dr. Sena Hitch of colorectal surgery and was found to have 5-6 cm mass in the left lateral aspect of the anus.     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       PMH:  Anal fissure  High cholesterol    Fam Hx:  Prostate cancer in 51s  Mother with cancer in 90s.    Social Hx:  Never smoker      Review of Systems   General ROS: negative for  fatigue or weight loss or anorexia  Respiratory ROS: no cough, shortness of breath, or wheezing  Gastrointestinal ROS: negative for - abdominal pain, constipation, diarrhea or nausea/vomiting  Head and Neck ROS: no thrush or mucositis  Cardiovascular ROS: No chest pain or palpitations  Derm ROS: No new rashes  CNS: Negative for new focal weakness or numbness.  Extremities: Negative worsening edema or cyanosis.  GU: Negative for incontinence of Dysuria  Skeleton: Negative for difficulty with ambulation or focal bone pain    The remainder of the patient's 14 point review of symptoms is negative       Objective:     Wt Readings from Last 3 Encounters:   12/06/14 68 kg (150 lb)     Neck - supple, no significant adenopathy  Lymphatics - no palpable lymphadenopathy, no hepatosplenomegaly  Chest - clear to auscultation, no wheezes, rales or rhonchi, symmetric air entry  Heart - normal rate, regular rhythm, normal S1, S2, no murmurs, rubs, clicks or gallops  Abdomen - soft, nontender, nondistended, no masses or organomegaly  Extremities - peripheral pulses normal, no pedal edema, no clubbing or cyanosis, no pedal edema noted,no clubbing or erythema  Skin - normal coloration and turgor, no rashes,  no suspicious skin lesions noted       Rads:     No results found.         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 I am a little concerned about her cough.  -Initiate planning for concurrent chemo/rt. If PET scan shows metastatic disease, would alter the plan.  HOwever, we will start treatment planning.  She will see Dr. Tylene Fantasia on 9/27. I would assume that treatment could be initiated around 10/9.    Tx plan :  5-FU 1000 mg/m2/day continuous infusion over 24 hours per day for four days (96 hours) on days 1-4 and again on days 29-32.  Mitomycin 10 mg/m2 on day 1 only    -rad/onc consult  -mediport    Goals of care:  Curative

## 2018-06-20 NOTE — Progress Notes (Signed)
GI MULTI-DISCIPLINARY CLINIC   RADIATION ONCOLOGY CONSULTATION     Encounter Date:  06/20/2018  Patient Name:  Jill Ross  Patient Age:  67 y.o.  Medical Record Number:  [id number]    Referring Physician:  Dr. Joesph Fillers, [facility]  Tawni Millers, Md  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 female who presents with 2-3 month history of anal pressure, change in bowel habits with increasing constipation, and intermittent rectal bleeding.  Her symptoms were initially attributed to hemorrhoids.  Rectal exam with Dr. Sena Hitch on 06/09/18 revealed prolapsed anal mass in left lateral aspect extending into anal canal measuring 5-6 cm.  Biopsy confirmed squamous cell carcinoma.    She presents today to GI Multi-Disciplinary clinic to discuss treatment options for her anal cancer.      RECOMMENDATIONS AND PLAN:     1. CANCER TREATMENT:  Treatment options were discussed with the patient including chemotherapy, radiation therapy, and surgery.  After Multi-Disciplinary discussion in GI clinic with Dr. Lady Gary, we recommended curative intent organ preservation with chemoradiotherapy.    2. STAGING:  PET/CT scan is planned to complete staging and assess disease burden, as well as to utilize for radiation treatment planning and targeting.    3. RADIATION PLANNING:  She was in agreement with our plan.  As she lives closer to our department at [facility], she will be meeting with Dr. Lawerance Cruel on 06/24/18 to finalize plans for radiation treatment.  A CT Simulation can be performed in our clinic at this time to begin the planning process for the patient's radiation therapy.  We discussed 6 weeks of treatment with concurrent chemotherapy, likely 5-FU and Mitomycin C with Dr. Lady Gary.  I explained in detail the risks, benefits, side effects, logistics, alternatives to and rationale  of radiation treatments. The possibility of severe and permanent radiation damage to normal tissues within the radiated area was also discussed.  The patient had the opportunity to have all of her questions and concerns addressed regarding her diagnosis and proposed treatment.  She was given our contact information, and she was told that she could call or come to our clinic at anytime if she has any questions for concerns prior to her next visit.      HISTORY OF PRESENTATION:   Jill Ross is a 67 y.o. female who is seen in consultation at the request of Dr. Lady Gary for evaluation of her anal cancer.  Pertinent oncologic history is reviewed with the patient and in the medical records, including:    02/2018:  She presented to her primary care physician with intermittent anal pain with hematochezia, initially attributed to hemorrhoids.  This was treated with hemorrhoid cream without improvement in symptoms.  Her last colonoscopy was about 5 years prior, when polyps removed.    06/09/18:  She was evaluated by Dr. Tamera Punt of [facility] in [place].  Digital rectal exam revealed prolapsed anal mass in the left lateral quadrant extending into the canal measuring about 5-6 cm.  Biopsy of anal mass revealed moderately differentiated invasive squamous cell carcinoma.      Currently, Jill Ross reports intermittent rectal bleeding since June 2019, with constipation and mild perianal discomfort.  She has lost about 10 lbs, and is now on  stool softeners.  She denies fecal urgency or incontinence.  No dysuria, hematuria, frequency or vaginal bleeding.  The patient also endorses worsening cough over the past month, but no fevers, chills, shortness of breath, chest pains or hemoptysis.      PAST MEDICAL AND SURGICAL HISTORY:     Past Medical History:   Diagnosis Date   . Gall stones    . Gastroesophageal reflux disease    . Hepatitis     A      Past Surgical History:   Procedure Laterality  Date   . COLONOSCOPY     . HYSTERECTOMY     . LAPAROSCOPIC, CHOLECYSTECTOMY, CHOLANGIOGRAM N/A 12/10/2014    Procedure: LAPAROSCOPIC CHOLECYSTECTOMY WITH CHOLANGIOGRAM;  Surgeon: Rickey Primus, MD;  Location: [facility];  Service: General;  Laterality: N/A;   . TUBAL LIGATION        Prior Radiation Therapy:  No  Pacemaker:  No   Pregnancy status:  No - postmenopausal, status post hysterectomy  Collagen Vascular Disease:  No  Inflammatory Bowel Disease:  No      FAMILY HISTORY:   Her family history includes Colon cancer in her mother; Prostate cancer in her father. She She indicated that her mother is deceased. She indicated that her father is deceased.    Family History   Problem Relation Age of Onset   . Colon cancer Mother    . Prostate cancer Father          SOCIAL HISTORY:   Jill Ross  reports that she has never smoked. She has never used smokeless tobacco. She reports current alcohol use. She reports that she does not use drugs.   Social History     Patient does not qualify to have social determinant information on file (likely too young).   Social History Narrative    She lives in [place], [place].  Retired.         MEDICATIONS:   Reviewed and updated in the electronic medical record EPIC, including:    Current Outpatient Medications:   .  aspirin EC 81 MG EC tablet, Take 81 mg by mouth daily., Disp: , Rfl:   .  atorvastatin (LIPITOR) 10 MG tablet, Take 10 mg by mouth daily, Disp: , Rfl: 1  .  Cholecalciferol (VITAMIN D PO), Take by mouth., Disp: , Rfl:       ALLERGIES:   Reviewed and updated in the electronic medical record EPIC, including:    She has No Known Allergies.      REVIEW OF SYSTEMS:   A comprehensive 12-organ review of systems was performed and is negative, except for pertinent positives as mentioned above in the HPI.       PHYSICAL EXAM:   Vital Signs for this encounter reviewed in EPIC including:   BP 107/72   Pulse 93   Temp 98 F (36.7 C) (Oral)   Ht 1.626 m (5\' 4" )   Wt 66 kg  (145 lb 8 oz)   BMI 24.98 kg/m   Wt Readings from Last 3 Encounters:   06/20/18 66 kg (145 lb 8 oz)   06/20/18 66 kg (145 lb 8 oz)   12/06/14 68 kg (150 lb)     Karnofsky Performance Status:  90%  General:  Well-appearing, pleasant well-nourished female, in no acute distress.  Neurologic:  No focal cranial nerve deficits bilaterally, steady gait, sensation grossly intact throughout, motor strength 5/5 upper and lower extremities.  Psychiatric:  Alert and  oriented X 3, thought content appropriate, euthymic.  Head:  Normocephalic, without obvious abnormality, atraumatic  Eyes:  Conjunctivae and sclerae clear without icterus.  Pupils equal, round, reactive to light, extraocular movement's intact.  ENT:  Tongue midline with normal mobility, oropharynx pink and moist without lesions.  Neck:  Supple, symmetrical, trachea midline.  Back:  No spinal or CVA tenderness to palpation.  Lungs:  Clear normal work of breathing with no accessory muscle use or respiratory distress, no wheezing or crackles audible.  Heart:  Regular rate, warm and well-perfused.  Pulses:  2+ and symmetric.  Abdomen:  Soft, non-tender, non-distended, no masses palpable, no rigidity or guarding.  Extremities:  No cyanosis or pedal edema, moving all extremities equally with full range of motion.  Skin:  Skin color, texture, turgor normal. No rashes or lesions.  No jaundice.  Lymph Nodes:  No palpable cervical, supraclavicular or inguinal adenopathy bilaterally.  Rectal Exam:  Deferred today.      DIAGNOSTIC STUDIES:   Relevant imaging studies, laboratory and pathology results were personally reviewed, as detailed above in the HPI, including:      Complete Blood Count:  Lab Results   Component Value Date    WBC 11.23 (H) 06/20/2018    HGB 11.6 06/20/2018    HCT 35.1 06/20/2018    MCV 85.8 06/20/2018    PLT 412 (H) 06/20/2018    NEUTROABS 9.75 (H) 06/20/2018       Chemistries:  Lab Results   Component Value Date    CREAT 0.9 06/20/2018    BUN 13.2  06/20/2018    NA 138 06/20/2018    K 4.3 06/20/2018    CL 102 06/20/2018    CO2 25 06/20/2018    GLU 130 (H) 06/20/2018       Liver Function Testing:  Lab Results   Component Value Date    ALT 15 06/20/2018    AST 18 06/20/2018    ALKPHOS 95 06/20/2018    BILITOTAL 0.5 06/20/2018    ALB 3.6 06/20/2018       Coagulation Studies:  Lab Results   Component Value Date    INR 1.1 06/20/2018    PT 13.6 06/20/2018    PTT 26 06/20/2018           Thank you for allowing [place] to participate in the management and care of this patient.   If I may answer any questions, please do not hesitate to contact me at any time.       Loistine Chance, MD  [facility]  [facility], [facility]  [address] [place] [address]  Phone:  [phone]  Fax:  [phone]      20 minutes of the 30 minute visit was spent counseling and coordinating subsequent care. Specifically, we discussed the diagnosis and recommended therapy for her disease. We discussed the treatment recommendations in detail and subsequent follow-up was outlined.

## 2018-06-20 NOTE — Progress Notes (Signed)
[name]     Legal Name: Jill Ross   67 y.o., Oct 05, 1950   Legal Sex: Female   MRN: [id number]     Anal cancer  [C21.0]    Dr. Lady Gary    A. Intent of Therapy: CURATIVE    B. Regimen (dosing and frequency):        5-FU 1000 mg/m2/day continuous infusion over 24 hours per day for four days (96 hours) on days 1-4 and again on days 29-32.  Mitomycin 10 mg/m2 on day 1 only              C. Facility:  [facility]     D. Start Date: 10/9    E. Risk factors for growth factor:     F. Additional Notes:

## 2018-06-21 NOTE — Progress Notes (Signed)
Treatment Plan sent to Insurance Pool for authorization.

## 2018-06-22 ENCOUNTER — Ambulatory Visit: Payer: No Typology Code available for payment source | Attending: Colon & Rectal Surgery

## 2018-06-22 DIAGNOSIS — C210 Malignant neoplasm of anus, unspecified: Secondary | ICD-10-CM

## 2018-06-22 MED ORDER — ONDANSETRON 8 MG PO TBDP
8.0000 mg | ORAL_TABLET | Freq: Three times a day (TID) | ORAL | 2 refills | Status: DC | PRN
Start: 2018-06-22 — End: 2018-08-12

## 2018-06-22 MED ORDER — LIDOCAINE-PRILOCAINE 2.5-2.5 % EX CREA
TOPICAL_CREAM | CUTANEOUS | 1 refills | Status: DC
Start: 2018-06-22 — End: 2019-03-07

## 2018-06-22 NOTE — Progress Notes (Signed)
The patient came to [facility] today for teaching about the Mitomycin + CI Fluorouracil for treatment of anal cancer. She is unaccompanied.  A brief overview of the logistics of outpatient chemotherapy treatment was given, as well as a printed schedule with location, dates and times of treatment.    A demonstration on the use of the Mediport was given, including accessing, de-accessing, and applying EMLA cream using the PortReady model.  Use of the CADD pump in this regimen was reviewed, including care of the pump, not showering while wearing it, adjusting to sleeping with the pump, and returning to the infusion clinic for disconnection.    The side-effects of Mitomycin and 5FU were discussed, including, but not limited to:  nausea, vomiting, mouth sores, diarrhea, fatigue, hair loss, hand-foot syndrome, poor appetite, taste changes (metallic taste), sensitivity to light, watery eyes, and nail changes.  An explanation of the effect of chemo on the production of blood cells was also given, with emphasis on the white blood cells and the immune system.    Management of side-effects was then reviewed: good hand-washing; resting when necessary, but remaining as active as possible; eating smaller more frequent meals; increasing hydration; good oral care; taking OTC and prescribed medications as directed; and knowing when to call the office for further support with symptoms.      Printed material of teaching was given to the patient, as well as important telephone numbers to call when needed.  Information on Life With Cancer was also provided, and a referral was made to the Registered Dietician. Medications were e-scribed to the patient's pharmacy at the [facility] in [place], [place], on [address].    The Consent for Biotherapy/Chemotherapy was signed by the patient, and witnessed and signed by this RN.  The patient was then taken for a tour of the Infusion Clinic on the ninth floor.

## 2018-06-22 NOTE — Patient Instructions (Signed)
AT-HOME MEDICATIONS      Medications for nausea      Zofran (Ondansetron) Take 8 mg (one tablet) every 8 hours as needed for nausea.    . Most common side effects:  headache, constipation.   Take Zofran first.   If nausea is not relieved with Zofran, take one Compazine tablet.          EMLA Cream (lidocaine cream)    Apply to Mediport site and cover with plastic wrap one hour before chemotherapy.

## 2018-06-24 ENCOUNTER — Encounter: Payer: Self-pay | Admitting: Radiation Oncology

## 2018-06-24 ENCOUNTER — Ambulatory Visit: Payer: Self-pay | Admitting: Radiation Oncology

## 2018-06-24 ENCOUNTER — Ambulatory Visit
Admission: RE | Admit: 2018-06-24 | Discharge: 2018-06-24 | Disposition: A | Discharge status: Home / Self Care | Payer: No Typology Code available for payment source | Source: Ambulatory Visit | Attending: Colon & Rectal Surgery | Admitting: Colon & Rectal Surgery

## 2018-06-24 DIAGNOSIS — C210 Malignant neoplasm of anus, unspecified: Secondary | ICD-10-CM | POA: Insufficient documentation

## 2018-06-24 DIAGNOSIS — Z7189 Other specified counseling: Secondary | ICD-10-CM | POA: Insufficient documentation

## 2018-06-24 NOTE — Progress Notes (Signed)
RADIATION ONCOLOGY FOLLOW-UP NOTE    Date of Service: 06/24/2018    Referring Physician: Dr. Lady Gary    HISTORY OF PRESENT ILLNESS:   CC: Anal cancer    Jill Ross was seen today her history of stage IIB (cT3N0M0) G2 SCC of the anal canal with skin/vulvar involvement s/p biopsy referred for evaluation and discussion of treatment options.     Oncologic History:   She presented to her primary care physician in 02/2018 with intermittent anal pain with hematochezia, initially attributed to hemorrhoids.  This was treated with hemorrhoid cream without improvement in symptoms.  Her last colonoscopy was about 5 years prior, when polyps were removed.  She eventually was referred to Dr. Tamera Punt in 05/2018.  Digital rectal exam revealed prolapsed anal mass in the left lateral quadrant extending into the canal measuring about 5-6 cm.  Biopsy of anal mass revealed moderately differentiated invasive squamous cell carcinoma.  She met with Dr. Lady Gary and Dr. Iran Planas on 06/20/18 and definitive chemoradiation was recommended after PET-CT.  She is scheduled for PET-CT on 06/27/18.  She lives in [place] and preferred radiotherapy locally and is thus referred here.      Currently, Jill Ross reports intermittent rectal bleeding since June 2019, with constipation and mild perianal discomfort.  She has lost about 10 lbs, and is now on stool softeners.  She denies fecal urgency or incontinence.  No dysuria, hematuria, frequency or vaginal bleeding.  The patient also endorses worsening cough over the past month, but no fevers, chills.  She denies SOB, cough, wheezing, chest pain, hemoptysis, palpitations, abdominal pain, N/V, headaches, vision changes, seizures, numbness/tingling, focal weakness, ataxia, falls, or bony pain. No other complaints.    I have reviewed Jill Ross's medical, surgical and other pertinent history in detail, and have updated medication and allergy information in the electronic medical record.     ROS: Pertinent ROS as noted above. Otherwise negative or non-contributory.     PHYSICAL EXAM:   BP 125/67 (BP Site: Right arm)   Pulse 89   Temp 98.6 F (37 C) (Temporal)   Resp 18   Wt 65.5 kg (144 lb 6.4 oz)   SpO2 96%   BMI 24.79 kg/m   ECOG: 0  PAIN SCORE: 3  PAIN INTERVENTION: tylenol  CONSTITUTIONAL: This is a well developed, well-nourished female in NAD, appears stated age.   NECK: Supple, with no thyromegaly, and non-tender. No tracheal deviation. No cervical or SCL LAD  CARDIAC: Regular rate and rhythm. Normal S1, S2. No murmurs, rubs or gallops.   PULMONARY: Lungs are CTAB without w/r/r.    ABDOMINAL: soft, NT, ND. No HSM. Normoactive bs throughout. No guarding, rebound.   PELVIS: external genitalia with L>R erythema at the lower/posterior vulvar labia majora without labia minora extension. Bimanual exam with mass palpable from vagina but with no erosion of the vaginal tissue. S/p hysterecomy with cuff intact.  ANAL: visible protruding 1.5 cm mass with palpable 5 cm internal anal mass without vaginal extension on vaginal exam.  BACK: Straight & aligned, no CVA tenderness. Axial skeleton non-tender to percussion.   EXTREMITIES: Full ROM in all extremities, particularly b/l LE. No c/c/e. No lymphedema      LABS:   Lab Results   Component Value Date    WBC 11.23 (H) 06/20/2018    HGB 11.6 06/20/2018    HCT 35.1 06/20/2018    MCV 85.8 06/20/2018    PLT 412 (H) 06/20/2018  Lab Results   Component Value Date    NA 138 06/20/2018    K 4.3 06/20/2018    CL 102 06/20/2018    CO2 25 06/20/2018       RADIOLOGY: see HPI      ASSESSMENT AND PLAN:   67 y.o. female with stage IIB (cT3N0M0) G2 SCC of the anal canal with skin/vulvar involvement s/p biopsy referred for evaluation and discussion of treatment options.     Ihad a long and detailed discussion with Jill Ross   with regard to herdiagnosis, imaging and pathologic studies as well as various treatment options available to her.[place] locally  advanced anal cancer with pending PET-CT.  I discussed that standard of care for T3N0 rectal cancer is definitive chemoradiation with 54 Gy in 30 fractions to the primary tumor with margin with an SIB boost to the vag cuff with 5 Gy in 5 fractions delivered to the 0.5 cm depth.      I reviewed what a course of pelvic external beam radiotherapy entails.Radiation proceeds over 5 weeks delivered 5 days a week, M-F, about 20 minute sessions per day.The process for planning a radiation course including placement of markers around the gross disease, the need for CT simulation in the supine position, generation of a virtual 3 dimensional conformal radiation therapy plan from the acquired CT images, and verification of the computer generated plan prior to beginning treatment was explained.I reviewed with patient the acute and late side effects of treatment.She was made aware that the acute side effects she may experience include, but are not limited to, dysuria, urinary frequency, diarrhea, vaginal irritation, and fatigue.The temporary nature of these symptoms was explained, the supportive therapies that are available should they occur reviewed and the average timeline for their resolution was given.I did discuss that there is a risk for permanent complications from pelvic EBRT.These include but are not limited to risk of small bowel obstruction, chronic change in bowel and urinary frequency, radiation cystitis, proctitis, fistula formation, vaginal dryness, shortening of vaginal canal, loss of fertility, and a remote small risk for a secondary cancer.The patient demonstrated good understanding of discussion and all questions were answered to her satisfaction.She will proceed with CT simulation today and begin radiotherapy shortly thereafter with concurrent 5FU/MMC with Dr. Lady Gary.  Chemotherapy is already scheduled to start on 07/05/18.  Her PET_CT is scheduled for 06/27/18.The patient was instructed to  call in interim with any questions or concerns.    Thank you for allowing me to participate in the care of this patient.     25 minutes of 30 minutes were spent in direct patient counseling and coordination of care.     Acie Fredrickson, MD MS  [facility]  [facility]

## 2018-06-24 NOTE — Progress Notes (Signed)
Date Completed: 06/24/18    Patient is a 67 yo female with diagnosis of anal cancer. Per PG-SGA screening, patient indicated decreased weight over the last 2 weeks with less than usual PO intake. Nutrition impact symptoms reported: poor appetite, constipation. Activity level reported as "not feeling up to most things, but in bed or chair less than half the day".    Calculated PG-SGA score: 8    Nutrition Risk per PG-SGA screening: high  Nutrition Risk per Diagnosis: high    RDN will follow up per nutrition protocol with patient. Consult PRN.    Racheal Patches, RDN  Outpatient Oncology Dietitian  [facility] [place], [place], [place]) / [phone]  [facility] [place], [place]) / [phone]

## 2018-06-24 NOTE — Progress Notes (Signed)
Anal cancer by biopsy on 06/09/18.  Saw Dr. Lady Gary on 06/20/18--plan is to have concurrent chemo of 5FU and Mitomycin.  Will have PET on 06/27/18 as ordered by Dr. Orlene Erm about a persistent cough.  Saw Dr. Iran Planas on 06/20/18.  Here for Simulation.

## 2018-06-24 NOTE — Pre-Procedure Instructions (Signed)
Port Placement 06/28/18- WBC 11.23- clearance req from Dr Lowell Bouton.

## 2018-06-24 NOTE — Progress Notes (Signed)
Outpatient Oncology Nutrition Assessment  [facility]    Jill Ross  67 y.o. female  DOB: 1951-09-14  MRN: [id number]    Reason for Assessment: high nutrition risk d/t PG-SGA score and anal CA    Referral Source: [facility]    NUTRITION INTERVENTIONS / RECOMMENDATIONS     1) Discussed with Jill Ross that the main nutrition related goal of treatment is wt maintenance. With Jill Ross having significant wt loss already, recommended that Jill Ross consider trying Ensure or Boost once per day to stabilize wt prior to starting treatment. Gave her samples and coupons to try.     2) Went over diet strategies for common side effects related to pelvic radiation including loose stools and diarrhea. Jill Ross is currently experiencing constipation, so encouraged yogurt and fluids for bowel regularity. She is taking a stool softener which is helping relieve constipation.     Education Materials Provided:  1) Your Diet During Pelvic Radiation  2) Nutrition During Cancer Treatment           NUTRITION ASSESSMENT     Cancer Diagnosis: anal CA    Cancer Treatment(s):   - Jill Ross will be starting concurrent chemo and radiation d/t anal CA in the next few weeks.     Past Medical History:  has a past medical history of Gall stones, Gastroesophageal reflux disease, and Hepatitis.    Past Surgical History:  has a past surgical history that includes Hysterectomy; Tubal ligation; Colonoscopy; and LAPAROSCOPIC, CHOLECYSTECTOMY, CHOLANGIOGRAM (N/A, 12/10/2014).    Current Medications:  Current Outpatient Medications   Medication Sig   . atorvastatin (LIPITOR) 10 MG tablet Take 10 mg by mouth daily   . Cholecalciferol (VITAMIN D PO) Take 2,000 Unit by mouth daily      . docusate sodium (COLACE) 100 MG capsule Take 100 mg by mouth daily   . lidocaine-prilocaine (EMLA) cream Apply EMLA Cream to Mediport site one hour before treatment.  Cover with plastic wrap.   . ondansetron (ZOFRAN-ODT) 8 MG disintegrating tablet Take 1 tablet (8 mg total) by mouth every 8  (eight) hours as needed for Nausea (or vomiting.)       Labs: (06/20/18) glu 130    Anthropometrics:  Height: 162.6 cm (64 in)  Weight: 65.5 kg (144.4 lb)     BMI: 24.8, normal    Ideal Body Weight: 54.5 kg (120 lb) = 120% IBW    Usual Body Weight: 70.5 kg (155 lb) = 93% UBW    Weight Changes:       Wt Readings from Last 30 Encounters:   06/24/18 65.5 kg (144 lb 6.4 oz)   06/20/18 66 kg (145 lb 8 oz)   06/20/18 66 kg (145 lb 8 oz)   12/06/14 68 kg (150 lb)   Jill Ross reports 10 lb wt loss over 5 weeks, 6.5% change    Nutrition Focused Physical Exam: appears well nourished.     Side Effects / Barriers to PO intake: constipation, poor appetite    Diet History: regular, hasn't ever used Ensure/Boost before    Diet Recall:  Jill Ross reports having yogurt, banana and coffee for breakfast this morning. Otherwise eats a lot of fruits and vegetables and lean meats, not much grain.     Physical Activity: she has dogs, so she walks them leisurely daily.     PG-SGA Score: 8    Estimated Nutrition Needs:  Calories: 7829-5621 Kcal/day (27-32 Kcal/kg using wt of 65.5 kg)   Protein: 66-79 gm/day (  1-1.2 gm/kg using wt of 65.5 kg)   Fluid: 1610-9604 mL/day (1 mL/Kcal)       NUTRITION SUMMARY     Patient is a 67 y.o. female diagnosed with anal cancer who will be undergoing concurrent chemo and radiation soon, had her simulation today. Jill Ross reports eating a healthy, varied diet and has lost 10 lb unintentionally in the last 5 weeks d/t poor appetite and eating less. She says she has some pain and that she has recently started taking a stool softener which has helped relieve her constipation.     NUTRITION DIAGNOSIS     Inadequate oral intake related to poor appetite and increased needs d/t CA as evidenced by wt loss in 10 lb in 5 weeks (6.5% change) and Jill Ross report.     Food- and nutrition-related knowledge deficit related to lack of prior education on diet/nutrition recommendations during cancer treatment, recovery, and survivorship as evidenced by  discussion with patient.    NUTRITION INTERVENTIONS / RECOMMENDATIONS     1) Discussed with Jill Ross that the main nutrition related goal of treatment is wt maintenance. With Jill Ross having significant wt loss already, recommended that Jill Ross consider trying Ensure or Boost once per day to stabilize wt prior to starting treatment. Gave her samples and coupons to try.     2) Went over diet strategies for common side effects related to pelvic radiation including loose stools and diarrhea. Jill Ross is currently experiencing constipation, so encouraged yogurt and fluids for bowel regularity. She is taking a stool softener which is helping relieve constipation.     Education Materials Provided:  1) Your Diet During Pelvic Radiation  2) Nutrition During Cancer Treatment    NUTRITION MONITORING / EVALUATION     Monitoring:  1) Weight, with goal of preventing unintentional weight loss during treatment and recovery.  2) Calorie/protein intake, with goal of consuming >75% estimated nutrition needs.  3) Treatment side effects, with goal of mitigating impact of side effects on ability to consume adequate calorie/protein intake as able.    Follow Up: weekly once radiation treatments start; provided patient with my contact information and encouraged her to contact me as needed with any nutrition questions or concerns throughout treatment and recovery.    Racheal Patches, RDN  Oncology Dietitian  [facility] [place], [place], [place]) / [phone]  [facility] [place], [place]) / [phone]

## 2018-06-27 ENCOUNTER — Telehealth: Payer: No Typology Code available for payment source

## 2018-06-27 ENCOUNTER — Other Ambulatory Visit: Payer: Self-pay | Admitting: Radiation Oncology

## 2018-06-27 ENCOUNTER — Other Ambulatory Visit: Payer: Self-pay

## 2018-06-27 DIAGNOSIS — C210 Malignant neoplasm of anus, unspecified: Secondary | ICD-10-CM

## 2018-06-27 NOTE — Pre-Procedure Instructions (Signed)
Received " Ok to proceed with elevated WBC Order"

## 2018-06-27 NOTE — Progress Notes (Signed)
MITOMYCIN + 5 CI FU NPR w/ insurance. Okay to proceed with appointment on 07/06/2018.

## 2018-06-27 NOTE — Pre-Procedure Instructions (Signed)
   Procedure Verified:Mediport Placement 06/28/2018 @ 1000   Pt ID verified   Arrival time at 0830 w read back , parking at [employer] [facility], check in at CATH/EP lab registration, [facility] grd flr    Nothing to eat and drink after midnight, w read back   May take morning meds Per IR guidelines w sips of water, Reinforced medication instructions by MD   Pt instructed to bring medication list dos   Labs done 06/20/2018 in epic    Ride arranged with Husband Paulita Fujita to begin 07/05/2018

## 2018-06-28 ENCOUNTER — Other Ambulatory Visit: Payer: Self-pay | Admitting: Radiation Oncology

## 2018-06-28 ENCOUNTER — Ambulatory Visit: Payer: No Typology Code available for payment source

## 2018-06-28 ENCOUNTER — Ambulatory Visit
Admission: RE | Admit: 2018-06-28 | Discharge: 2018-06-28 | Disposition: A | Discharge status: Home / Self Care | Payer: No Typology Code available for payment source | Source: Ambulatory Visit | Attending: Diagnostic Radiology | Admitting: Diagnostic Radiology

## 2018-06-28 ENCOUNTER — Other Ambulatory Visit: Payer: Self-pay | Admitting: Hematology & Oncology

## 2018-06-28 ENCOUNTER — Encounter
Admission: RE | Disposition: A | Discharge status: Home / Self Care | Payer: Self-pay | Source: Ambulatory Visit | Attending: Diagnostic Radiology

## 2018-06-28 ENCOUNTER — Ambulatory Visit
Admission: RE | Admit: 2018-06-28 | Discharge: 2018-06-28 | Disposition: A | Discharge status: Home / Self Care | Payer: No Typology Code available for payment source | Source: Ambulatory Visit | Attending: Colon & Rectal Surgery | Admitting: Colon & Rectal Surgery

## 2018-06-28 DIAGNOSIS — C20 Malignant neoplasm of rectum: Secondary | ICD-10-CM | POA: Insufficient documentation

## 2018-06-28 DIAGNOSIS — C210 Malignant neoplasm of anus, unspecified: Secondary | ICD-10-CM | POA: Insufficient documentation

## 2018-06-28 DIAGNOSIS — Z510 Encounter for antineoplastic radiation therapy: Secondary | ICD-10-CM | POA: Insufficient documentation

## 2018-06-28 DIAGNOSIS — Z90710 Acquired absence of both cervix and uterus: Secondary | ICD-10-CM | POA: Insufficient documentation

## 2018-06-28 HISTORY — DX: Other specified cough: R05.8

## 2018-06-28 HISTORY — PX: MEDIPORT PLACEMENT: IMG2599

## 2018-06-28 SURGERY — MEDIPORT PLACEMENT
Anesthesia: Conscious Sedation

## 2018-06-28 MED ORDER — MIDAZOLAM HCL 2 MG/2ML IJ SOLN
INTRAMUSCULAR | Status: AC
Start: 2018-06-28 — End: ?
  Filled 2018-06-28: qty 4

## 2018-06-28 MED ORDER — HEPARIN SOD (PORK) LOCK FLUSH 100 UNIT/ML IV SOLN
INTRAVENOUS | Status: AC
Start: 2018-06-28 — End: ?
  Filled 2018-06-28: qty 5

## 2018-06-28 MED ORDER — FENTANYL CITRATE (PF) 50 MCG/ML IJ SOLN (WRAP)
INTRAMUSCULAR | Status: AC
Start: 2018-06-28 — End: ?
  Filled 2018-06-28: qty 4

## 2018-06-28 MED ORDER — FENTANYL CITRATE (PF) 50 MCG/ML IJ SOLN (WRAP)
INTRAMUSCULAR | Status: AC | PRN
Start: 2018-06-28 — End: 2018-06-28
  Administered 2018-06-28: 25 ug via INTRAVENOUS

## 2018-06-28 MED ORDER — MIDAZOLAM HCL 5 MG/ML IJ SOLN
INTRAMUSCULAR | Status: AC | PRN
Start: 2018-06-28 — End: 2018-06-28
  Administered 2018-06-28: .5 mg via INTRAVENOUS

## 2018-06-28 MED ORDER — FENTANYL CITRATE (PF) 50 MCG/ML IJ SOLN (WRAP)
INTRAMUSCULAR | Status: AC | PRN
Start: 2018-06-28 — End: 2018-06-28
  Administered 2018-06-28: 50 ug via INTRAVENOUS

## 2018-06-28 MED ORDER — SODIUM CHLORIDE 0.9 % IV MBP
1.0000 g | Freq: Once | INTRAVENOUS | Status: AC
Start: 2018-06-28 — End: 2018-06-28
  Administered 2018-06-28: 1 g via INTRAVENOUS
  Filled 2018-06-28: qty 1000

## 2018-06-28 MED ORDER — LIDOCAINE HCL (PF) 1 % IJ SOLN
INTRAMUSCULAR | Status: AC
Start: 2018-06-28 — End: ?
  Filled 2018-06-28: qty 30

## 2018-06-28 MED ORDER — HEPARIN (PORCINE) IN NACL 2-0.9 UNIT/ML-% IJ SOLN (WRAP)
INTRAVENOUS | Status: AC
Start: 2018-06-28 — End: ?
  Filled 2018-06-28: qty 1000

## 2018-06-28 MED ORDER — FENTANYL CITRATE (PF) 50 MCG/ML IJ SOLN (WRAP)
INTRAMUSCULAR | Status: AC
Start: 2018-06-28 — End: ?
  Filled 2018-06-28: qty 2

## 2018-06-28 MED ORDER — SODIUM CHLORIDE 0.9 % IV SOLN
INTRAVENOUS | Status: DC
Start: 2018-06-28 — End: 2018-06-28

## 2018-06-28 MED ORDER — ONDANSETRON HCL 4 MG/2ML IJ SOLN
INTRAMUSCULAR | Status: AC | PRN
Start: 2018-06-28 — End: 2018-06-28
  Administered 2018-06-28: 4 mg via INTRAVENOUS

## 2018-06-28 MED ORDER — MIDAZOLAM HCL 5 MG/ML IJ SOLN
INTRAMUSCULAR | Status: AC | PRN
Start: 2018-06-28 — End: 2018-06-28
  Administered 2018-06-28: 1 mg via INTRAVENOUS

## 2018-06-28 MED ORDER — MIDAZOLAM HCL 2 MG/2ML IJ SOLN
INTRAMUSCULAR | Status: AC
Start: 2018-06-28 — End: ?
  Filled 2018-06-28: qty 2

## 2018-06-28 MED ORDER — LIDOCAINE-EPINEPHRINE 1 %-1:100000 IJ SOLN
INTRAMUSCULAR | Status: AC
Start: 2018-06-28 — End: ?
  Filled 2018-06-28: qty 20

## 2018-06-28 MED ORDER — ONDANSETRON HCL 4 MG/2ML IJ SOLN
INTRAMUSCULAR | Status: AC
Start: 2018-06-28 — End: ?
  Filled 2018-06-28: qty 2

## 2018-06-28 NOTE — Progress Notes (Signed)
Pt admitted to room 12 for mediport placement with Dr Jaynie Collins, ID verified. Alert and oriented, vss, hemodynamically stable. Aldrete =10, denies pain, in no apparent distress. Confirmed procedure and NPO status. Procedure reviewed with pt and spouse, questions answered. Lab results in EPIC, need H&P and consent for procedure.

## 2018-06-28 NOTE — Sedation Documentation (Signed)
For mediport placement

## 2018-06-28 NOTE — Sedation Documentation (Signed)
Report to Nikki

## 2018-06-28 NOTE — Progress Notes (Signed)
S/p mediport placement.  Vital signs stable and no c/o pain. Assisted out of bed and ambulated x 2 without difficulty.  Able to void without difficulty.Right chest incision site clean, dry, and intact; no bleeding or hematoma and pulses palpable.  Discharge instructions reviewed with patient including medications, activity restrictions, diet, and return visit to MD. Patient verbalized understanding. A copy of instructions given to patient. Patient discharged via wheelchair and home with husband.

## 2018-06-28 NOTE — Sedation Documentation (Signed)
In lab, pause, on table, padded, O2 at 3lpm, prep by tech, Beth, sedation and pain medications per physician

## 2018-06-28 NOTE — Progress Notes (Signed)
Received patient into [facility] room 25 s/p mediport placement with Dr. Jaynie Collins at [phone].  Vital signs stable, no c/o pain. Aldrete score 10.  Patinet's ID and allergies verified.  Right chest incision site clean, dry, and intact, pulses present, and no signs of bleeding or hematoma. Oriented to room and call light.  Fall precautions in place; patient educated on post procedure precautions and verbalizes understanding. IV infusing.  Family called to the bedside.  Call light within reach. Nourishment offered.

## 2018-06-28 NOTE — Sedation Documentation (Signed)
Dr Jaynie Collins placing right chest power Vaccess CT Mediport lot# [id number]

## 2018-06-28 NOTE — Brief Op Note (Signed)
BRIEF IR PROCEDURE NOTE    Date Time: 06/28/18 11:25 AM    Patient Name:   Jill Ross    Date of Operation:   06/28/2018    Providers Performing:   Surgeon(s):  Rex Kras, MD    Assistant (s):  RT  Operative Procedure:   Procedure(s):  MEDIPORT PLACEMENT    Preoperative Diagnosis:   Pre-Op Diagnosis Codes:     * Anal cancer [C21.0]    Postoperative Diagnosis:   * No post-op diagnosis entered *    Anesthesia:   ( X ) FENTANYL  ( X ) VERSED  ( X ) LOCAL  (  ) GENERAL ANESTHESIA (DEPT OF ANESTHESIOLOGY) )    Estimated Blood Loss:   0 CC      CONTRAST   NONE        Findings:   RIJ SINGLE LUMEN POWER PORT PLACED WITH TIP AT SVC    Complications:   NONE      CHEST PORT READY FOR IMMEDIATE USE      Signed by: Rex Kras, MD                                                                              [facility] CATH

## 2018-06-28 NOTE — Discharge Instr - AVS First Page (Signed)
Reason for your Hospital Admission:  Mediport Placement      Instructions for after your discharge:  Interventional Cardiovascular Admission and Recovery        Interventional Radiology  Discharge Instructions for Mediport Placement    Your Mediport was placed today by  Dr. Lim.    The port is designed to provide you reliable long-term venous access.    The port is placed in a small pocket created under your skin.  The pocket is closed with absorbable sutures that do not have to be removed.  The skin is then covered with an adhesive skin glue (Dermabond).    1. DO NOT GET THE INCISION WET FOR 24  HOURS.  Then shower or bathe as usual, but do not submerge the incision in water until completely healed (approximately 7 days).   2. Keep the area clean and dry.   3. Do not scrub the incision. Gently wash with soap and water.  4. After bathing, pat the area dry with a soft towel.  5. It is not necessary to place a bandage over the incision.   6. Do not scratch, rub, or pick at the film. The skin glue film will fall off naturally in 5 to 10 days. Do not place tape directly over the film.  7. Do not apply liquids (such as peroxide), ointments, or creams to the incision while the film is in place.  8. There is a smaller incision at the base of your neck or on the chest where the catheter was inserted into the vein.  Treat this incision the same as the other incision.  9. Most incisions heal without problems. However, an infection sometimes occurs despite proper treatment. Observe for signs of infection:    a. redness, warmth, swelling, drainage, or temperature greater than 101 degrees F.    b. If you suspect infection, call the Interventional Radiologist.    You may experience soreness or mild pain at the site for a couple days.  You may take Acetaminophen (Tylenol) if needed. You may also apply a cool pack.    Schedule a follow-up appointment with the Interventional Radiology Clinic at [phone] to be seen  approximately 7-10 days after your Mediport placement.  This is for an incision/site check.    If you have questions or concerns, please call an Interventional Radiologist:    Contact Numbers: [phone]

## 2018-06-28 NOTE — Progress Notes (Signed)
BRIEF IR HISTORY AND PHYSICAL    Date Time: 06/28/18 10:16 AM      INDICATIONS:   Procedure(s):  MEDIPORT PLACEMENT          PAST MEDICAL HISTORY:     Past Medical History:   Diagnosis Date   . Dry cough Since 04/2018   . Gall stones    . Hepatitis 1963    "A" at 81yrs of age   . Malignant neoplasm 04/2018    Anal Cancer        PAST SURGICAL HISTORY     Past Surgical History:   Procedure Laterality Date   . COLONOSCOPY  Last 01/2013   . HYSTERECTOMY  1990   . LAPAROSCOPIC, CHOLECYSTECTOMY, CHOLANGIOGRAM N/A 12/10/2014    Procedure: LAPAROSCOPIC CHOLECYSTECTOMY WITH CHOLANGIOGRAM;  Surgeon: Rickey Primus, MD;  Location: [facility];  Service: General;  Laterality: N/A;   . TUBAL LIGATION  1988       Family History:     Family History   Problem Relation Age of Onset   . Colon cancer Mother 12   . Prostate cancer Father 78       Social History:     Social History     Socioeconomic History   . Marital status: Married     Spouse name: Not on file   . Number of children: Not on file   . Years of education: Not on file   . Highest education level: Not on file   Occupational History   . Not on file   Social Needs   . Financial resource strain: Not on file   . Food insecurity:     Worry: Not on file     Inability: Not on file   . Transportation needs:     Medical: Not on file     Non-medical: Not on file   Tobacco Use   . Smoking status: Never Smoker   . Smokeless tobacco: Never Used   Substance and Sexual Activity   . Alcohol use: Yes     Comment: occa.   . Drug use: No   . Sexual activity: Yes     Partners: Male     Birth control/protection: Post-menopausal   Lifestyle   . Physical activity:     Days per week: Not on file     Minutes per session: Not on file   . Stress: Not on file   Relationships   . Social connections:     Talks on phone: Not on file     Gets together: Not on file     Attends religious service: Not on file     Active member of club or organization: Not on file     Attends meetings of clubs or  organizations: Not on file     Relationship status: Not on file   . Intimate partner violence:     Fear of current or ex partner: Not on file     Emotionally abused: Not on file     Physically abused: Not on file     Forced sexual activity: Not on file   Other Topics Concern   . Not on file   Social History Narrative    She lives in [place], [place].  Retired.         REVIEW OF SYSTEMS REVIEWED:   YES  (  X)        HOME MEDICATIONS     Prior to Admission medications  Medication Sig Start Date End Date Taking? Authorizing Provider   atorvastatin (LIPITOR) 10 MG tablet Take 10 mg by mouth every evening    05/31/18  Yes [provider]   Cholecalciferol (VITAMIN D3) 2000 units Tab Take by mouth every evening   Yes [provider]   docusate sodium (COLACE) 100 MG capsule Take 100 mg by mouth as needed       [provider]   lidocaine-prilocaine (EMLA) cream Apply EMLA Cream to Mediport site one hour before treatment.  Cover with plastic wrap. 06/22/18   Orpah Cobb, MD   ondansetron (ZOFRAN-ODT) 8 MG disintegrating tablet Take 1 tablet (8 mg total) by mouth every 8 (eight) hours as needed for Nausea (or vomiting.) 06/22/18   Orpah Cobb, MD         INPATIENT MEDICATIONS     Current Facility-Administered Medications   Medication Dose Route Frequency   . ceFAZolin  1 g Intravenous Once     . sodium chloride             ALLERGIES:   No Known Allergies      PREVIOUS REACTION TO SEDATION MEDICATIONS     NO (X )   YES ( )      PHYSICAL EXAM     AIRWAY CLASSIFICATION:    CLASS I   (  )   CLASS II  (X  )    CLASS III  (  )     CLASS IV  (  )    INTUBATED (  )    CARDIAC :   (X  )  RRR  (  )  IRREG  (  )  MURMUR      LUNGS:   (X  )  CLEAR  (  )  DIMINISHED    (  ) LEFT   (  )  RIGHT  (  )  ABSENT          (  ) LEFT   (  )  RIGHT  (  )  TUBES            (  ) LEFT   (  )  RIGHT          ABDOMEN:   SOFT      NEURO:   AAOX3          LABS:     Lab Results   Component Value Date/Time    WBC 11.23  (H) 06/20/2018 10:44 AM    HCT 35.1 06/20/2018 10:44 AM    PLT 412 (H) 06/20/2018 10:44 AM    INR 1.1 06/20/2018 10:44 AM    PT 13.6 06/20/2018 10:44 AM    PTT 26 06/20/2018 10:44 AM    BUN 13.2 06/20/2018 10:44 AM    CREAT 0.9 06/20/2018 10:44 AM    GLU 130 (H) 06/20/2018 10:44 AM    K 4.3 06/20/2018 10:44 AM           ASA PHYSICAL STATUS   (  )  ASA 1   HEALTHY PATIENT  (X  )  ASA 2   MILD SYSTEMIC ILLNESS  (  )  ASA 3   SYSTEMIC DISEASE, NOT INCAPACITATING  (  )  ASA 4   SEVERE SYSTEMIC DISEASE, DISEASE IS CONSTANT THREAT TO LIFE  (  )  ASA 5   MORIBUND CONDITION, NOT EXPECTED TO LIVE >24 HOURS  IRRESPECTIVE OF PROCEDURE  (  )  E           EMERGENCY PROCEDURE       PLANNED SEDATION:   (  ) NO SEDATION  ( X ) MODERATE SEDATION  (  ) DEEP SEDATION WITH ANESTHESIA      CONCLUSION:   PATIENT HAS BEEN REASSESSED IMMEDIATELY PRIOR TO THE PROCEDURE   AND IS AN APPROPRIATE CANDIDATE FOR THE PLANNED SEDATION AND   PROCEDURE.  RISKS, BENEFITS AND ALTERNATIVES TO THE PLANNED   PROCEDURE AND SEDATION HAVE BEEN EXPLAINED TO THE PATIENT   OR GUARDIAN.    (X  )  YES  (  )  EMERGENCY CONSENT         Signed by Elizbeth Squires, MD  Interventional Radiologist  [facility]- Section of Vascular & Interventional Radiology    Contact Numbers:    Regular business hours (8A-5P M-F):  [facility]:  [facility]:  [facility]: [phone]    After hours:  Answering service:  [phone]

## 2018-06-29 ENCOUNTER — Encounter: Payer: Self-pay | Admitting: Diagnostic Radiology

## 2018-06-29 ENCOUNTER — Telehealth: Payer: Self-pay | Admitting: Hematology & Oncology

## 2018-06-29 ENCOUNTER — Ambulatory Visit: Payer: No Typology Code available for payment source

## 2018-07-01 ENCOUNTER — Encounter: Payer: Self-pay | Admitting: Hematology & Oncology

## 2018-07-04 ENCOUNTER — Other Ambulatory Visit: Payer: Self-pay | Admitting: Radiation Oncology

## 2018-07-04 DIAGNOSIS — C210 Malignant neoplasm of anus, unspecified: Secondary | ICD-10-CM

## 2018-07-05 ENCOUNTER — Ambulatory Visit: Payer: No Typology Code available for payment source

## 2018-07-05 ENCOUNTER — Other Ambulatory Visit: Payer: Self-pay

## 2018-07-05 DIAGNOSIS — C210 Malignant neoplasm of anus, unspecified: Secondary | ICD-10-CM

## 2018-07-05 MED ORDER — GABAPENTIN 100 MG PO CAPS
100.00 mg | ORAL_CAPSULE | Freq: Three times a day (TID) | ORAL | 0 refills | Status: DC
Start: 2018-07-05 — End: 2018-07-21

## 2018-07-05 MED ORDER — GADOTERATE MEGLUMINE 7.5 MMOL/15ML IV SOLN
10.00 mL | Freq: Once | INTRAVENOUS | Status: AC | PRN
Start: 2018-07-05 — End: 2018-07-05
  Administered 2018-07-05: 15:00:00 5 mmol via INTRAVENOUS

## 2018-07-05 MED ORDER — TRAMADOL HCL 50 MG PO TABS
50.00 mg | ORAL_TABLET | Freq: Four times a day (QID) | ORAL | 0 refills | Status: AC | PRN
Start: 2018-07-05 — End: 2018-07-12

## 2018-07-06 ENCOUNTER — Ambulatory Visit: Payer: No Typology Code available for payment source

## 2018-07-06 NOTE — Progress Notes (Signed)
Patient called and stated that she had picked up the Tramadol and would take that but does not wish to take the gabapentin at this point.  Dr. Lawerance Cruel notified.

## 2018-07-07 ENCOUNTER — Other Ambulatory Visit: Payer: Self-pay

## 2018-07-08 ENCOUNTER — Encounter: Payer: Self-pay | Admitting: Radiation Oncology

## 2018-07-08 ENCOUNTER — Other Ambulatory Visit: Payer: Self-pay | Admitting: Hematology & Oncology

## 2018-07-08 DIAGNOSIS — C210 Malignant neoplasm of anus, unspecified: Secondary | ICD-10-CM

## 2018-07-08 NOTE — Progress Notes (Signed)
PET at [facility] on 06/27/18    FINDINGS: Liver parenchymal maximum SUV 2.1 and mediastinal blood pool maximum SUV 1.5.           ABDOMEN AND PELVIS: There is intensely hypermetabolic anal mass with likely prolapse 4.0 x 7.4 cm maximum SUV 18.4. There is mild presacral soft tissue thickening and stranding with low level uptake.           The anterior aspect of this mass is difficult to separate from inferior vagina. There are foci of mildly increased uptake in inferior vagina for example on the left maximum SUV 2.6. More superiorly, there is hypermetabolic asymmetric soft tissue along posterior right vaginal cuff approximately 1.7 x 2.6 cm maximum SUV 7.6.           There is minimal uptake in small left perirectal and left presacral lymph nodes for example left perirectal 7 x 8 mm maximum SUV 1.2 and presacral 6 x 7 mm maximum SUV 1.2.           There is a mildly hypermetabolic left inguinal lymph node 1.5 x 1.1 cm maximum SUV 2.2. There is low level uptake in bilateral pelvic lymph nodes for example: Left pelvic sidewall 1.0 x 0.9 cm maximum SUV 1.4, more   inferior 0.9 x 1.5 cm maximum SUV 1.7 and on the right 0.8 x 1.4 cm maximum SUV 1.4.           2.8 x 3.1 cm right adnexal cyst is photopenic. Follow-up pelvic ultrasound.  There has been cholecystectomy.          IMPRESSION:    1. Hypermetabolic anal mass compatible with anal cancer. Mild stranding and thickening of presacral soft tissue with low level uptake cannot exclude tumor involvement.   2. Hypermetabolic asymmetric soft tissue along posterior right vaginal cuff likely tumor involvement/adjacent metastatic lymph node. Foci of mildly increased uptake inferior vagina nonspecific. Correlate with clinical evaluation.   3. Mildly hypermetabolic left inguinal lymph node cannot exclude metastasis. Low level uptake bilateral pelvic lymph nodes and small left perirectal and presacral lymph nodes, inflammatory or metastasis.

## 2018-07-10 NOTE — Progress Notes (Signed)
Discussed case with patient and Dr. [place] Hamilton with gyn onc.  Patient had total abdominal hysterectomy without BSO in 1990 for pre-cancerous changes (offered pap q75months vs hysterectomy).  She notes final pathology showed no malignancy.  She denies any vaginal bleeding/discharge.  On recent PET and MRI there is a right vaginal cuff tumor mass appearing to be attached to the anorectal mass and likely directly extends.  I did not feel anything on vaginal exam but superior exam was limited by pain near introitus from anal tumor.      Acie Fredrickson, MD MS  [facility]  [facility]

## 2018-07-11 ENCOUNTER — Encounter: Payer: Self-pay | Admitting: Family

## 2018-07-11 ENCOUNTER — Ambulatory Visit: Payer: No Typology Code available for payment source | Admitting: Family

## 2018-07-11 ENCOUNTER — Ambulatory Visit (INDEPENDENT_AMBULATORY_CARE_PROVIDER_SITE_OTHER): Payer: No Typology Code available for payment source | Admitting: Family

## 2018-07-11 ENCOUNTER — Ambulatory Visit: Payer: No Typology Code available for payment source | Attending: Hematology & Oncology

## 2018-07-11 VITALS — BP 111/70 | HR 114 | Temp 97.9°F | Wt 141.3 lb

## 2018-07-11 VITALS — BP 107/71 | HR 99 | Temp 98.3°F | Wt 140.4 lb

## 2018-07-11 DIAGNOSIS — C210 Malignant neoplasm of anus, unspecified: Secondary | ICD-10-CM

## 2018-07-11 DIAGNOSIS — Z5111 Encounter for antineoplastic chemotherapy: Secondary | ICD-10-CM | POA: Insufficient documentation

## 2018-07-11 LAB — COMPREHENSIVE METABOLIC PANEL
ALT: 42 U/L (ref 0–55)
AST (SGOT): 29 U/L (ref 5–34)
Albumin/Globulin Ratio: 0.6 — ABNORMAL LOW (ref 0.9–2.2)
Albumin: 3 g/dL — ABNORMAL LOW (ref 3.5–5.0)
Alkaline Phosphatase: 151 U/L — ABNORMAL HIGH (ref 37–106)
BUN: 11.9 mg/dL (ref 7.0–19.0)
Bilirubin, Total: 0.3 mg/dL (ref 0.1–1.2)
CO2: 26 mEq/L (ref 21–29)
Calcium: 9.4 mg/dL (ref 8.5–10.5)
Chloride: 103 mEq/L (ref 100–111)
Creatinine: 0.8 mg/dL (ref 0.4–1.5)
Globulin: 4.7 g/dL — ABNORMAL HIGH (ref 2.0–3.7)
Glucose: 113 mg/dL — ABNORMAL HIGH (ref 70–100)
Potassium: 3.9 mEq/L (ref 3.5–5.1)
Protein, Total: 7.7 g/dL (ref 6.0–8.3)
Sodium: 139 mEq/L (ref 136–145)

## 2018-07-11 LAB — CBC AND DIFFERENTIAL
Absolute NRBC: 0 10*3/uL (ref 0.00–0.00)
Basophils Absolute Automated: 0.08 10*3/uL (ref 0.00–0.08)
Basophils Automated: 0.8 %
Eosinophils Absolute Automated: 0.12 10*3/uL (ref 0.00–0.44)
Eosinophils Automated: 1.2 %
Hematocrit: 31.1 % — ABNORMAL LOW (ref 34.7–43.7)
Hgb: 10.2 g/dL — ABNORMAL LOW (ref 11.4–14.8)
Immature Granulocytes Absolute: 0.03 10*3/uL (ref 0.00–0.07)
Immature Granulocytes: 0.3 %
Lymphocytes Absolute Automated: 0.92 10*3/uL (ref 0.42–3.22)
Lymphocytes Automated: 9.2 %
MCH: 27.6 pg (ref 25.1–33.5)
MCHC: 32.8 g/dL (ref 31.5–35.8)
MCV: 84.3 fL (ref 78.0–96.0)
MPV: 9.9 fL (ref 8.9–12.5)
Monocytes Absolute Automated: 0.6 10*3/uL (ref 0.21–0.85)
Monocytes: 6 %
Neutrophils Absolute: 8.23 10*3/uL — ABNORMAL HIGH (ref 1.10–6.33)
Neutrophils: 82.5 %
Nucleated RBC: 0 /100 WBC (ref 0.0–0.0)
Platelets: 372 10*3/uL — ABNORMAL HIGH (ref 142–346)
RBC: 3.69 10*6/uL — ABNORMAL LOW (ref 3.90–5.10)
RDW: 14 % (ref 11–15)
WBC: 9.98 10*3/uL — ABNORMAL HIGH (ref 3.10–9.50)

## 2018-07-11 LAB — GFR: EGFR: >60

## 2018-07-11 MED ORDER — DIPHENHYDRAMINE HCL 50 MG/ML IJ SOLN
25.00 mg | Freq: Once | INTRAMUSCULAR | Status: DC | PRN
Start: 2018-07-11 — End: 2018-07-18

## 2018-07-11 MED ORDER — LORAZEPAM 1 MG PO TABS
1.00 mg | ORAL_TABLET | Freq: Once | ORAL | Status: DC | PRN
Start: 2018-07-11 — End: 2018-07-18

## 2018-07-11 MED ORDER — SODIUM CHLORIDE 0.9 % IV SOLN
250.00 mL | INTRAVENOUS | Status: AC
Start: 2018-07-11 — End: 2018-07-12
  Administered 2018-07-11: 12:00:00 250 mL via INTRAVENOUS

## 2018-07-11 MED ORDER — FAMOTIDINE 10 MG/ML IV SOLN (WRAP)
20.00 mg | Freq: Once | INTRAVENOUS | Status: DC | PRN
Start: 2018-07-11 — End: 2018-07-18

## 2018-07-11 MED ORDER — FLUOROURACIL 5 GM/100ML IV SOLN
4000.00 mg/m2 | Freq: Once | INTRAVENOUS | Status: AC
Start: 2018-07-11 — End: 2018-07-15
  Administered 2018-07-11: 13:00:00 6800 mg via INTRAVENOUS
  Filled 2018-07-11: qty 100

## 2018-07-11 MED ORDER — SODIUM CHLORIDE 0.9 % IV BOLUS
500.00 mL | Freq: Once | INTRAVENOUS | Status: DC | PRN
Start: 2018-07-11 — End: 2018-07-18

## 2018-07-11 MED ORDER — HEPARIN SOD (PORK) LOCK FLUSH 100 UNIT/ML IV SOLN
5.00 mL | INTRAVENOUS | Status: DC | PRN
Start: 2018-07-11 — End: 2018-07-18
  Filled 2018-07-11: qty 5

## 2018-07-11 MED ORDER — SODIUM CHLORIDE 0.9 % IV SOLN
25.00 mL/h | INTRAVENOUS | Status: DC | PRN
Start: 2018-07-11 — End: 2018-07-18

## 2018-07-11 MED ORDER — SODIUM CHLORIDE 0.9 % IJ SOLN
5.00 mL | INTRAMUSCULAR | Status: DC | PRN
Start: 2018-07-11 — End: 2018-07-18

## 2018-07-11 MED ORDER — MITOMYCIN 20 MG IV SOLR
10.00 mg/m2 | Freq: Once | INTRAVENOUS | Status: AC
Start: 2018-07-11 — End: 2018-07-11
  Administered 2018-07-11: 12:00:00 17 mg via INTRAVENOUS
  Filled 2018-07-11: qty 17

## 2018-07-11 MED ORDER — ONDANSETRON IVPB 8 MG IN NS 50 ML
8.00 mg | Freq: Once | INTRAVENOUS | Status: AC
Start: 2018-07-11 — End: 2018-07-11
  Administered 2018-07-11: 12:00:00 8 mg via INTRAVENOUS
  Filled 2018-07-11: qty 50

## 2018-07-11 MED ORDER — LORAZEPAM 2 MG/ML IJ SOLN
0.5000 mg | INTRAMUSCULAR | Status: DC | PRN
Start: 2018-07-11 — End: 2018-07-18

## 2018-07-11 MED ORDER — ALBUTEROL SULFATE (2.5 MG/3ML) 0.083% IN NEBU
2.50 mg | INHALATION_SOLUTION | Freq: Once | RESPIRATORY_TRACT | Status: DC | PRN
Start: 2018-07-11 — End: 2018-07-18

## 2018-07-11 MED ORDER — EPINEPHRINE HCL 1 MG/ML IJ SOLN (WRAP)
0.30 mg | Freq: Once | Status: DC | PRN
Start: 2018-07-11 — End: 2018-07-18

## 2018-07-11 MED ORDER — DIPHENHYDRAMINE HCL 50 MG/ML IJ SOLN
50.00 mg | Freq: Once | INTRAMUSCULAR | Status: DC | PRN
Start: 2018-07-11 — End: 2018-07-18

## 2018-07-11 MED ORDER — HYDROCORTISONE SOD SUC (PF) 100 MG IJ SOLR (WRAP)
100.00 mg | Freq: Once | INTRAMUSCULAR | Status: DC | PRN
Start: 2018-07-11 — End: 2018-07-18

## 2018-07-11 NOTE — Progress Notes (Signed)
Subjective:         Anal cancer    06/20/2018 Initial Diagnosis     Anal cancer      07/05/2018 - 07/05/2018 Chemotherapy     fluorouracil (ADRUCIL) 6,920 mg in sodium chloride 0.9 % 388.4 mL chemo infusion CADD cassette, 4,000 mg/m2, Intravenous, Once - Over 96 hours, 0 of 1 cycle  mitoMYcin (MUTAMYCIN) 17.25 mg in sterile water (preservative free) 34.5 mL chemo infusion, 10 mg/m2, Intravenous, Once, 0 of 1 cycle      07/11/2018 -  Chemotherapy     fluorouracil (ADRUCIL) 6,800 mg in sodium chloride 0.9 % 386 mL chemo infusion CADD cassette, 4,000 mg/m2 = 6,800 mg, Intravenous, Once - Over 96 hours, 0 of 1 cycle  mitoMYcin (MUTAMYCIN) 17 mg in sterile water (preservative free) 34 mL chemo infusion, 10 mg/m2 = 17 mg, Intravenous, Once, 0 of 1 cycle               Treatment History:  5-FU/Mytomycin    Interval History:  Jill Ross is a 67 year old female with stage II anal cancer on cycle 1 day 1 of neoadjuvant 5-FU/mytomycin. She has moderate pain in the anal region, which is treated with Tramadol. She has BRBPR with bowel movements. Denies n/v, constipation, diarrhea, and abdominal pain. Her dry cough has improved.     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    She presented with one month of anal pain and pressure in August 2019. Initially, she was treated for hemorrhoids without therapeutic effect. She was seen by Dr. Sena Hitch of colorectal surgery and was found to have 5-6 cm mass in the left lateral aspect of the anus.     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         Review of Systems   General ROS: (-) Fatigue, weight loss, or anorexia  Respiratory ROS: Mild cough (-)  shortness of breath, or wheezing  Gastrointestinal ROS: BRBPR, anal discomfort (-) Abdominal pain, constipation, diarrhea, nausea, or vomiting  Head and Neck ROS: (-) Thrush or mucositis  Cardiovascular ROS: (-) Chest pain or palpitations  Derm ROS: (-) New rashes  CNS: (-) New  focal weakness or numbness.  Extremities: (-) Worsening edema or cyanosis.  GU: (-) Incontinence, dysuria, or nocturia  Skeleton: (-) Difficulty with ambulation or focal bone pain    The remainder of the patient's 12 point review of symptoms is negative       Objective:     Wt Readings from Last 3 Encounters:   07/11/18 64.1 kg (141 lb 4.8 oz)   07/05/18 64 kg (141 lb)   06/28/18 64 kg (141 lb)     Vitals:    07/11/18 0908   BP: 111/70   Pulse: (!) 114   Temp: 97.9 F (36.6 C)   SpO2: 97%       Neck: Supple, no significant adenopathy  Lymphatics: No palpable lymphadenopathy or hepatosplenomegaly  Chest: Clear to auscultation, no wheezes, rales or rhonchi, symmetric air entry  Heart: Normal rate, regular rhythm, normal S1, S2, no murmurs  Abdomen: Soft, nontender, nondistended, no masses or organomegaly. Bowel sounds normo-active x4 quadrants  Extremities: No pedal edema, clubbing, cyanosis, or erythema. Peripheral pulses normal  Skin:  No rashes or suspicious skin lesions noted. Normal coloration and turgor       Rads:     No results found.  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 Lawerance Cruel x5 weeks  -I will see her next week for follow-up/symptom management and possible labs.     Pain  -Tramadol PRN    Goals of Care:   Curative

## 2018-07-11 NOTE — Progress Notes (Signed)
[name] - Adult Infusion   Visit Date: 07/11/2018      Jill Ross is a 67 y.o. female patient of Orpah Cobb, MD    This is her first visit, she has been doing well.   Patient presents to the infusion center for Cycle 1, Day 1.   Overall, she states her energy level is good.  The patient is able to perform most usual functions.. Her appetite is reported to be intact, with no significant changes in weight.. She reports no complaints. She has no other concerns. There are no social work or other referral needs at this time.    Diagnosis: Anal  Treatment on Clinical Trial: Not On Study (NOS)  [facility](O) - OP Adult - Anal - Fluorouracil + MitoMYcin (Concurrent w/XRT)      IV Pre Hydration: N/A  Urine Parameters:N/A  Pre-Medications: ondansetron    Line Type: Mediport,    Right Port  Blood Return verified prior to administration: Yes    Vital Signs:    Patient Vitals for the past 12 hrs:   BP Temp Pulse   07/11/18 0957 107/71 98.3 F (36.8 C) 99      Body surface area is 1.7 meters squared.    '  Chemistry        Component Value Date/Time    NA 139 07/11/2018 1027    K 3.9 07/11/2018 1027    CL 103 07/11/2018 1027    CO2 26 07/11/2018 1027    BUN 11.9 07/11/2018 1027    CREAT 0.8 07/11/2018 1027    GLU 113 (H) 07/11/2018 1027        Component Value Date/Time    CA 9.4 07/11/2018 1027    ALKPHOS 151 (H) 07/11/2018 1027    AST 29 07/11/2018 1027    ALT 42 07/11/2018 1027    BILITOTAL 0.3 07/11/2018 1027            Lab Results   Component Value Date    WBC 9.98 (H) 07/11/2018    HGB 10.2 (L) 07/11/2018    PLT 372 (H) 07/11/2018    NEUTROABS 8.23 (H) 07/11/2018         Chemo Given within the past 12 hours:   "Chemo/Biotherapy Given" (last 12 hours)     Date/Time Action User Medication Dose Rate Dose Volume (mL) Rate    07/11/18 1233 New Bag    BK fluorouracil (ADRUCIL) 6,800 mg in sodium chloride 0.9 % 250 mL chemo infusion CADD cassette 6,800 mg 2.6 mL/hr       07/11/18 1215 Stopped    BK  mitoMYcin (MUTAMYCIN) 17 mg in sterile water (preservative free) 34 mL chemo infusion  0 mL/hr       07/11/18 1210 New Bag    BK mitoMYcin (MUTAMYCIN) 17 mg in sterile water (preservative free) 34 mL chemo infusion 17 mg 408 mL/hr               Hypersensitivity Reaction Noted: No  IV Post Hydration: No, not required  Treatment Outcome:Patient tolerated treatment well. 5-FU CADD pump connected and infusing.     Education: Patient/Family educated regarding the expected outcomes/side effects possible interactions of treatments, medications and procedures. Patient watched CADD pump education video from infusystem. Patient verbalizes understanding.    Follow-up Plan: Discharged in stable condition. Accompanied by, husband. Instructed to contact physician if any complications or unmanageable symptoms occur.      RTC 07/15/18 for CADD pump  d/c    Gar Ponto, RN

## 2018-07-13 NOTE — Progress Notes (Signed)
[facility]  Nutrition Follow Up       Jill Ross  67 y.o. female  DOB:05/15/1951  ID: [id number]     Summary: Pt has completed 3/30 fractions radiation to pelvis d/t anal CA. She is also getting chemo. She reports no nausea, has had some loose stools so constipation is resolved. She is not eating much, has just had an Ensure, a banana and a few bites of macaroni and cheese. Wt is down 4 lb from 2.5 weeks ago.     Assessment:    Symptoms: loose stools     Diet: Bland Diet, says she's not eating anything heavy like red meat    Exercise: didn't discuss today    Additional Questions & Concerns: NA    Anthropometrics:  Estimated body mass index is 24.1 kg/m as calculated from the following:    Height as of 06/28/18: 1.626 m (5\' 4" ).    Weight as of 07/11/18: 63.7 kg (140 lb 6.4 oz).    UBW: 155 lb = 91%    Weight History:  Wt Readings from Last 10 Encounters:   07/11/18 63.7 kg (140 lb 6.4 oz)   07/11/18 64.1 kg (141 lb 4.8 oz)   07/05/18 64 kg (141 lb)   06/28/18 64 kg (141 lb)   06/24/18 65.5 kg (144 lb 6.4 oz)   06/20/18 66 kg (145 lb 8 oz)   06/20/18 66 kg (145 lb 8 oz)   12/06/14 68 kg (150 lb)       Labs: 07/11/18 Hgb/Hct 10.2/31/1    Estimated Nutrition Needs (based on 06/24/18 weight):  Calories: 5427-0623 Kcal/day (27-32 Kcal/kg using wt of 65.5 kg)   Protein: 66-79 gm/day (1-1.2 gm/kg using wt of 65.5 kg)   Fluid: 7628-3151 mL/day (1 mL/Kcal)     Nutrition Diagnosis:    Inadequate oral intake continues  Food and nutrition-related knowledge deficit continues    Plan/Nutrition Intervention:  Reiterated goal of wt maintenance. Pt did try samples (didn't like them, but was able to find flavors that she likes and is drinking one protein shake daily.) Encouraged pt to eat whatever sounds good to her and to try to eat when the craving strikes, not to try to ignore it. Provided positive reinforcement regarding eating mostly GI soft, easy to digest foods.      Goals:  1) Maintain current wt  within 5% during treatment and recovery. Met.   2) Consume greater than 75% estimated nutrition needs. Not met.   3) Treat side effects to be able to consume adequate calories/protein per above. Not met.     Follow Up: weekly, d/t high nutrition risk.       Racheal Patches, RDN  Outpatient Oncology Dietitian  [facility] [place], [place], [place]) / [phone]  [facility]iation [facility] On[facility] [place], [place]) / [phone]

## 2018-07-14 ENCOUNTER — Other Ambulatory Visit: Payer: Self-pay

## 2018-07-14 NOTE — Patient Instructions (Signed)
SUPPORTIVE CARE DURING RADIATION TO THE PELVIS    The following information will help you manage the side effects you may feel from the radiation therapy.  Your doctor or nurse is also available for any questions.  SKIN REACTION  The skin in the treated area may become reddened and tender, like sunburn or possibly dry, itchy and peeling.  Special care of the skin in the treatment area is important to help minimize reactions.    1. Wash with a gentle mild soap.  Rinse well and pat dry,  2. Wear loose fitting, soft clothing.  3. Avoid using a hot water bottle, heating pad or ice bag on treated skin.  4. Avoid any skin care products unless prescribed by your doctor or nurse.  BLADDER IRRITATION    You may notice a need to urinate more frequently.  You may feel a burning sensation or a feeling of pressure when you urinate.  Drink plenty of fluids during the day. Limiting fluids in the evening may help decrease urinary frequency at nighttime.  Report any symptoms to your doctor or nurse, as there are medications that can help you.      RECTAL IRRITATION  Rectal irritation can cause itching and soreness in the rectal area.  If you have hemorrhoids, radiation may irritate them.  If irritation occurs, these suggestions may be helpful.      1. Avoid rubbing, scratching or scrubbing the area.  Keep the area dry.   2. Use baby wipes after every bowel movement.  3. Soak in a sitz bath or tub of warm water 2-3 times daily  4. Diarrhea may occur after 2-3 weeks of therapy.  (See dietary guidelines for diarrhea).  You may take Imodium AD as directed by your nurse.    FATIGUE  During radiation therapy, your metabolism is increased as your body reacts to radiation effects on both normal and cancer cells.  Stress related to your illness, and changes in appetite may also contribute to fatigue.  The amount of fatigue varies with each person and does not occur immediately.  It seems to be cumulative, and so you may notice it more at the  end of the week and towards the end of your course of therapy.  It will gradually resolve when your treatment ends.    SEXUAL ACTIVITY  Sexual activity may be continued throughout your treatment course.  You will not be radioactive and there is no danger to your partner from the radiation or your cancer.  Women may experience decreased vaginal lubrication during radiation therapy and may use a water -soluble lubricant such as Replens, Astroglide, or K-Y Jelly.    AFTER YOU FINISH YOUR COURSE OF TREATMENT:  Bladder irritability will normally go away within four to six weeks.  Continue whatever your doctor prescribed for this.    Your bowels will gradually return to normal in the next several weeks.  If diarrhea continues, stay on the suggested diet and continue taking medication. as prescribed.    Women may experience vaginal discharge for several weeks.  A few women develop scar tissue in the vagina, causing it to become short and narrow.  It is important to keep the vagina open so your doctor can examine you.  The vagina can be kept open by having regular sexual intercourse and/or by using a plastic tube called a dilator.  Your doctor will advise you if this is necessary, and provide you with the appropriate instructions.       In an emergency, the Radiation Oncology Physician on call can be paged by dialing [phone].        Antioxidants and Radiation Treatment      Antioxidants are vitamins and minerals that have been shown to protect cells from the potential damage of free radical production. Examples of antioxidants include vitamin C, vitamin E, beta carotene, Coenzyme Q10, glutathione, N-acetylcysteine (NAC), selenium, genistein, diadzein, and quercetin.    Some individuals with cancer take large amounts of these vitamins and minerals in hopes that they might enhance their immune system or even destroy cancer cells. We strongly advise against this practice during radiation therapy. Studies have shown that  some of these substances may interfere with the tumor killing effects of radiation treatment. Radiation treatment works by creating free radicals, which kill the cancer cells. There is some evidence in animal studies and cell studies that antioxidants might block this mechanism by neutralizing the free radicals.    Many people feel that if a pill or supplement is available for sale that the products are safe and that the claims are true. The FDA does not control these products. At this time, there are no standards for safety, quality, contamination, or dose recommendations for these products. The USP seal on the label of vitamins and minerals signifies that the product has undergone a voluntary inspection by the [place] Pharmacopoeia which assess for absorption, contamination, and dosage.    A supplement that contains 100% of the RDA is considered to be beneficial for adults during radiation treatment and/or chemotherapy. Feel free to discuss your supplements with the cancer center dietitian or your physician.    In addition, you are encouraged to eat as healthfully as possible before, during, and after cancer treatment. Include fruits and vegetables that contain a wide variety of naturally occurring antioxidants in your daily diet.

## 2018-07-15 ENCOUNTER — Encounter: Payer: Self-pay | Admitting: Radiation Oncology

## 2018-07-15 ENCOUNTER — Ambulatory Visit: Payer: Self-pay | Admitting: Radiation Oncology

## 2018-07-15 ENCOUNTER — Ambulatory Visit: Payer: No Typology Code available for payment source | Attending: Hematology & Oncology

## 2018-07-15 VITALS — BP 103/69 | HR 113 | Temp 99.1°F | Resp 20 | Wt 143.1 lb

## 2018-07-15 DIAGNOSIS — C210 Malignant neoplasm of anus, unspecified: Secondary | ICD-10-CM

## 2018-07-15 MED ORDER — HEPARIN SOD (PORK) LOCK FLUSH 100 UNIT/ML IV SOLN
5.00 mL | INTRAVENOUS | Status: DC | PRN
Start: 2018-07-15 — End: 2018-07-15
  Administered 2018-07-15: 16:00:00 5 mL
  Filled 2018-07-15: qty 5

## 2018-07-15 MED ORDER — SODIUM CHLORIDE 0.9 % IJ SOLN
5.00 mL | INTRAMUSCULAR | Status: DC | PRN
Start: 2018-07-15 — End: 2018-07-15
  Administered 2018-07-15: 16:00:00 10 mL via INTRAVENOUS

## 2018-07-15 NOTE — Progress Notes (Signed)
07/15/18 1502   Radiation Therapy Patient Care - Pelvis, Female   Fraction  5   Daily Dose 180   Total Dose 900   Prescribed Dose 5400   Concurrent Therapy yes   Comfort Alteration   Karnofsky Performance Score 90%-can perform normal activity, minor signs of disease   Fatigue 3   Pain Score 0   Nutrition Alteration   Anorexia 2-oral intake significantly decreased   Nausea 1-able to eat   Vomiting 0-none   Weight 62 kg (136 lb 11.2 oz)   Elimination Alteration   Constipation 0 - None   Diarrhea 0-none   Urinary Frequency, Urgency 0-normal   Urinary Incontinence 0-absent   Dysuria 0-none   Skin Alteration   Radiation Dermatitis 0-none   Vital signs   Temp 97.8 F (36.6 C)   Temp Source Temporal   Heart Rate 92   Resp Rate 18   BP 121/63   Moderate fatigue  5FU done today, will have pump disconnected after leaving xrt.   Loose, soft stools.    Fall Risk done---Moderate Fall Risk   >>>>>>>>>>>>>>  Noted above  Good spirits, good attitude.  Discussed briefly the dramatic changes in treatment of anal cancer over the years.  Encouraged a positive outlook . . . though of course no guarantees.  I did not examine her today.  Continue treatment as planned.

## 2018-07-15 NOTE — Progress Notes (Signed)
Time: 1350    Jill Ross is a 67 y.o. female here for CADD pump d/c. Reports fatigue. Pump disconnected and port flushed with 10cc NS and 500u heparin. Needle removed intact.  See MAR, VS, Doc Flowsheets for more details.  Pt tolerated the infusion well without problems.    Time: 1600 , pt discharged stable, NAD.    RTC 08/08/18    Lizabeth Leyden, RN

## 2018-07-21 ENCOUNTER — Other Ambulatory Visit (FREE_STANDING_LABORATORY_FACILITY): Payer: No Typology Code available for payment source

## 2018-07-21 ENCOUNTER — Encounter: Payer: Self-pay | Admitting: Family

## 2018-07-21 ENCOUNTER — Ambulatory Visit: Payer: No Typology Code available for payment source | Attending: Family | Admitting: Family

## 2018-07-21 VITALS — BP 116/64 | HR 92 | Temp 97.7°F | Wt 139.9 lb

## 2018-07-21 DIAGNOSIS — C210 Malignant neoplasm of anus, unspecified: Secondary | ICD-10-CM

## 2018-07-21 LAB — COMPREHENSIVE METABOLIC PANEL
ALT: 79 U/L — ABNORMAL HIGH (ref 0–55)
AST (SGOT): 58 U/L — ABNORMAL HIGH (ref 5–34)
Albumin/Globulin Ratio: 0.7 — ABNORMAL LOW (ref 0.9–2.2)
Albumin: 2.9 g/dL — ABNORMAL LOW (ref 3.5–5.0)
Alkaline Phosphatase: 198 U/L — ABNORMAL HIGH (ref 37–106)
BUN: 7.2 mg/dL (ref 7.0–19.0)
Bilirubin, Total: 0.3 mg/dL (ref 0.1–1.2)
CO2: 27 mEq/L (ref 21–29)
Calcium: 8.8 mg/dL (ref 8.5–10.5)
Chloride: 105 mEq/L (ref 100–111)
Creatinine: 0.8 mg/dL (ref 0.4–1.5)
Globulin: 4.4 g/dL — ABNORMAL HIGH (ref 2.0–3.7)
Glucose: 121 mg/dL — ABNORMAL HIGH (ref 70–100)
Potassium: 4.4 mEq/L (ref 3.5–5.1)
Protein, Total: 7.3 g/dL (ref 6.0–8.3)
Sodium: 140 mEq/L (ref 136–145)

## 2018-07-21 LAB — CBC AND DIFFERENTIAL
Absolute NRBC: 0 10*3/uL (ref 0.00–0.00)
Basophils Absolute Automated: 0.01 10*3/uL (ref 0.00–0.08)
Basophils Automated: 0.6 %
Eosinophils Absolute Automated: 0.1 10*3/uL (ref 0.00–0.44)
Eosinophils Automated: 6 %
Hematocrit: 29.9 % — ABNORMAL LOW (ref 34.7–43.7)
Hgb: 9.6 g/dL — ABNORMAL LOW (ref 11.4–14.8)
Immature Granulocytes Absolute: 0.01 10*3/uL (ref 0.00–0.07)
Immature Granulocytes: 0.6 %
Lymphocytes Absolute Automated: 0.32 10*3/uL — ABNORMAL LOW (ref 0.42–3.22)
Lymphocytes Automated: 19.3 %
MCH: 27.2 pg (ref 25.1–33.5)
MCHC: 32.1 g/dL (ref 31.5–35.8)
MCV: 84.7 fL (ref 78.0–96.0)
MPV: 8.8 fL — ABNORMAL LOW (ref 8.9–12.5)
Monocytes Absolute Automated: 0.12 10*3/uL — ABNORMAL LOW (ref 0.21–0.85)
Monocytes: 7.2 %
Neutrophils Absolute: 1.1 10*3/uL (ref 1.10–6.33)
Neutrophils: 66.3 %
Nucleated RBC: 0 /100 WBC (ref 0.0–0.0)
Platelets: 191 10*3/uL (ref 142–346)
RBC: 3.53 10*6/uL — ABNORMAL LOW (ref 3.90–5.10)
RDW: 14 % (ref 11–15)
WBC: 1.66 10*3/uL — CL (ref 3.10–9.50)

## 2018-07-21 LAB — GFR: EGFR: >60

## 2018-07-21 LAB — MAGNESIUM: Magnesium: 2.1 mg/dL (ref 1.6–2.6)

## 2018-07-21 NOTE — Progress Notes (Signed)
Subjective:         Anal cancer    06/20/2018 Initial Diagnosis     Anal cancer      07/05/2018 - 07/05/2018 Chemotherapy     fluorouracil (ADRUCIL) 6,920 mg in sodium chloride 0.9 % 388.4 mL chemo infusion CADD cassette, 4,000 mg/m2, Intravenous, Once - Over 96 hours, 0 of 1 cycle  mitoMYcin (MUTAMYCIN) 17.25 mg in sterile water (preservative free) 34.5 mL chemo infusion, 10 mg/m2, Intravenous, Once, 0 of 1 cycle      07/11/2018 -  Chemotherapy     fluorouracil (ADRUCIL) 6,800 mg in sodium chloride 0.9 % 250 mL chemo infusion CADD cassette, 4,000 mg/m2 = 6,800 mg, Intravenous, Once - Over 96 hours, 1 of 1 cycle  Administration: 6,800 mg (07/11/2018)  mitoMYcin (MUTAMYCIN) 17 mg in sterile water (preservative free) 34 mL chemo infusion, 10 mg/m2 = 17 mg, Intravenous, Once, 1 of 1 cycle  Administration: 17 mg (07/11/2018)               Treatment History:  -5-FU/Mytomycin  -RT to pelvis    Interval History:  Jill Ross is a 67 year old female with stage II anal cancer on cycle 1 day 11 of neoadjuvant 5-FU/mytomycin and has completed 8/30 fractions of radiation to pelvis. She states that her anal pain and BRBPR are now minimal but loose stools have increased in frequency. For example, she had 5 loose stools Tuesday morning of this week. She is taking Imodium sparingly as she is afraid of inducing constipation. She successfully consumes at least 64 ounces of hydrating fluids daily and has 4-5 small meals daily. Pt endorses fatigue, denies fevers/chills, and denies nausea/vomiting.      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    She presented with one month of anal pain and pressurein August 2019. Initially, she was treated for hemorrhoids without therapeutic effect. She was seen by Dr. Sena Hitch of colorectal surgery and was found to have 5-6 cm mass in the left lateral aspect of the anus.  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   Started  chemoradiation.        Review of Systems   General ROS: Fatigue (-) weight loss, or anorexia  Respiratory ROS: Cough (-) shortness of breath, or wheezing  Gastrointestinal ROS: Diarrhea (-) Abdominal pain, constipation, nausea, or vomiting  Head and Neck ROS: (-) Thrush or mucositis  Cardiovascular ROS: (-) Chest pain or palpitations  Derm ROS: (-) New rashes  CNS: (-) New focal weakness or numbness.  Extremities: (-) Worsening edema or cyanosis.  GU: (-) Incontinence, dysuria, or nocturia  Skeleton: (-) Difficulty with ambulation or focal bone pain    The remainder of the patient's 12 point review of symptoms is negative       Objective:     Wt Readings from Last 3 Encounters:   07/21/18 63.5 kg (139 lb 14.4 oz)   07/15/18 64.9 kg (143 lb 1.6 oz)   07/15/18 62 kg (136 lb 11.2 oz)     Vitals:    07/21/18 0957   BP: 116/64   Pulse: 92   Temp: 97.7 F (36.5 C)   SpO2: 97%       Neck: Supple, no significant adenopathy  Lymphatics: No palpable lymphadenopathy or hepatosplenomegaly  Chest: Clear to auscultation, no wheezes, rales or rhonchi, symmetric air entry  Heart: Normal rate, regular rhythm, normal S1, S2, no murmurs  Abdomen: Soft,  nontender, nondistended, no masses or organomegaly. Bowel sounds normo-active x4 quadrants  Extremities: No pedal edema, clubbing, cyanosis, or erythema. Peripheral pulses normal  Skin:  No rashes or suspicious skin lesions noted. Normal coloration and turgor       Rads:     No results found.         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 Lawerance Cruel. Has completed 8 fractions/30.    Fatigue  -Pt is able to do chores around the house but rests after e.g. doing laundry  -Continue to drink at least 64 oz of fluid daily  -CBC    Diarrhea/Loose Stools  -Provided education regarding radiation-induced diarrhea and likelihood for it to continue/worsen   -Powdered bulking agent  provided by RD  -Imodium, 2mg  PRN. Max Daily dose is 16mg   -CMP, Mg    Pain, minimal, primarily due to positioning  -Suggested using a donut pillow for comfort  -Tramadol PRN    Chronic Cough  -CXR 9/10 unremarkable.  -Pt denies seasonal allergies, history of asthma. She took Zyrtec x1 month without relief. Cough is intermittent throughout the day, slightly more frequent at night.  -She will begin a journal of cough including time of day and recent foods eaten.  -Trial of Ranitidine 150mg  qhs PRN     Goals of Care:   Curative

## 2018-07-21 NOTE — Progress Notes (Signed)
[facility]  Nutrition Follow Up     Jill Ross  67 y.o. female  DOB:02-Sep-1951  ID: [id number]     Summary: Pt has completed 8/30 fractions radiation to pelvis d/t anal CA. She reports the pain is less, but the loose stools are worse - had 5+ episodes today so far. She is drinking plenty of fluids. Took some Imodium for the first time, but is concerned about constipation. Appetite is good, wt is up since starting radiation.     Assessment:    Symptoms: loose stools     Diet: low residue    Exercise: active with routine activities    Additional Questions & Concerns: concerned about loose stool episodes and the urgency associated with that will lead to accidents, also asked when she should worry if she doesn't have a BM since she took the Imodium.     Anthropometrics:  Estimated body mass index is 24.56 kg/m as calculated from the following:    Height as of 06/28/18: 1.626 m (5\' 4" ).    Weight as of 07/15/18: 64.9 kg (143 lb 1.6 oz).    UBW: 155 lb = 92%    Nutrition Focused Physical Exam:   Upper Body: no overt s/s malnutrition   Lower Body: deferred d/t pt fully clothed    Weight History:  Wt Readings from Last 10 Encounters:   07/15/18 64.9 kg (143 lb 1.6 oz)   07/15/18 62 kg (136 lb 11.2 oz)   07/11/18 63.7 kg (140 lb 6.4 oz)   07/11/18 64.1 kg (141 lb 4.8 oz)   07/05/18 64 kg (141 lb)   06/28/18 64 kg (141 lb)   06/24/18 65.5 kg (144 lb 6.4 oz)   06/20/18 66 kg (145 lb 8 oz)   06/20/18 66 kg (145 lb 8 oz)   12/06/14 68 kg (150 lb)   *pt said the first wt on 10/18 was an error    Labs:   Lab Results   Component Value Date    NA 139 07/11/2018    K 3.9 07/11/2018    CL 103 07/11/2018    CO2 26 07/11/2018    BUN 11.9 07/11/2018    CREAT 0.8 07/11/2018    GLU 113 (H) 07/11/2018    CA 9.4 07/11/2018    EGFR >60.0 07/11/2018    WBC 9.98 (H) 07/11/2018    HCT 31.1 (L) 07/11/2018    HGB 10.2 (L) 07/11/2018    AMY 66 11/02/2014    LIP 28 11/02/2014     Estimated Nutrition Needs (based on  06/24/18 weight):  Calories: 1769-2096Kcal/day (27-32Kcal/kg using wt of 65.5kg)   Protein: 66-79gm/day (1-1.2gm/kg using wt of 65.5kg)   Fluid: 1769-2026mL/day (1 mL/Kcal)     Nutrition Diagnosis:    Inadequate oral intake improving  Food and nutrition-related knowledge deficit continues    Plan/Nutrition Intervention:  Pt has to go to [employer] tomorrow for an appointment, told her about Banatrol as an option to help with loose stools. She was going to buy some and try it. Explained that the Imodium will likely help too and that as long as she is having at least a BM every 2-3 days, she shouldn't be concerned.     Goals:  1) Maintain current wt within 5% during treatment and recovery. Met.   2) Consume greater than 75% estimated nutrition needs. Met.   3) Treat side effects to be able to consume adequate calories/protein per above. Met.  Monitoring: will monitor wt, intake and GI tract symptoms during treatment.      Follow Up: weekly, d/t moderate-high nutrition risk.       Racheal Patches, RDN  Outpatient Oncology Dietitian  [facility] [place], [place], [place]) / [phone]  [facility] Radiation and Oncology Center [place], [place]) / [phone]

## 2018-07-22 ENCOUNTER — Ambulatory Visit: Payer: Self-pay | Admitting: Radiation Oncology

## 2018-07-22 NOTE — Progress Notes (Signed)
[place] RADIATION ONCOLOGY WEEKLY TREATMENT NOTE    Date Time:  3:30 PM 07/22/2018   Patient Name: Jill Ross, Jill Ross  Diagnosis: Anal cancer with vulvar extension    Current Dose and Fraction: 1800/5400 cGy in 10/30 fx including a SIB.    NURSING ASSESSMENT   07/22/18 1503   Radiation Therapy Patient Care - Pelvis, Female   Fraction  10   Daily Dose 180   Total Dose 1800   Prescribed Dose 5400   Concurrent Therapy yes   Site anus   Comfort Alteration   Karnofsky Performance Score 90%-can perform normal activity, minor signs of disease   Fatigue 4   Pain Score 2   Pain Loc RECTUM   Nutrition Alteration   Anorexia 1-loss of appetite   Nausea 0-none   Vomiting 0-none   Weight 64.5 kg (142 lb 3.2 oz)   Elimination Alteration   Constipation 0 - None   Diarrhea 2 - Increase of 4-6 stools/day or nocturnal stools   Urinary Frequency, Urgency 0-normal   Urinary Incontinence 0-absent   Dysuria 0-none   Skin Alteration   Radiation Dermatitis 0-none   Vital signs   Temp 98.1 F (36.7 C)   Temp Source Temporal   Heart Rate 97   Resp Rate 18   BP 113/67   Saw FNP at Dr. America Brown office yesterday.  Labs draw.    Some rectal soreness when sitting but has chairs at home that are more comfortable.  Some diarrhea  Occasionally taking imodium.  Appetite fair.  Drinking fluids without difficulty.  No nausea.  No complaint of skin irriation  Extremely fatigued.Chemotherapy last week.       PHYSICIAN ASSESSMENT    SUBJECTIVE:   Jill Ross is a 67 y.o. female who is seen today for on-treatment clinical management during radiation therapy.     Pertinent subjective/objective findings are documented in the above flowsheet    Saw FNP at Dr. America Brown office yesterday.  Labs draw.    Some rectal soreness when sitting but has chairs at home that are more comfortable.  Some diarrhea  Occasionally taking imodium.  Appetite fair.  Drinking fluids without difficulty.  No nausea.  No complaint of skin irriation  Extremely fatigued.Chemotherapy  last week.  Using [place] smooth cream and sitz baths.      Objective Findings:   BP 113/67   Pulse 97   Temp 98.1 F (36.7 C) (Temporal)   Resp 18   Wt 64.5 kg (142 lb 3.2 oz)   BMI 24.41 kg/m   Gen:NAD, sitting comfortably in chair  Anal: grade 1 radiation dermatitis    ASSESSMENT:     Tolerating treatment with expected toxicities given dose.    Prescription, setup, and dosimetry are appropriate. All port films and/or verification images have been reviewed by MD prior to next fraction and documented in [place].    Plan:  1.Continue Radiation as planned.   2. Continue skin care as instructed.     Acie Fredrickson, MD MS  Department of Radiation Oncology  [facility]

## 2018-07-22 NOTE — Progress Notes (Signed)
07/22/18 1503   Radiation Therapy Patient Care - Pelvis, Female   Fraction  10   Daily Dose 180   Total Dose 1800   Prescribed Dose 5400   Concurrent Therapy yes   Site anus   Comfort Alteration   Karnofsky Performance Score 90%-can perform normal activity, minor signs of disease   Fatigue 4   Pain Score 2   Pain Loc RECTUM   Nutrition Alteration   Anorexia 1-loss of appetite   Nausea 0-none   Vomiting 0-none   Weight 64.5 kg (142 lb 3.2 oz)   Elimination Alteration   Constipation 0 - None   Diarrhea 2 - Increase of 4-6 stools/day or nocturnal stools   Urinary Frequency, Urgency 0-normal   Urinary Incontinence 0-absent   Dysuria 0-none   Skin Alteration   Radiation Dermatitis 0-none   Vital signs   Temp 98.1 F (36.7 C)   Temp Source Temporal   Heart Rate 97   Resp Rate 18   BP 113/67   Saw FNP at Dr. America Brown office yesterday.  Labs draw.    Some rectal soreness when sitting but has chairs at home that are more comfortable.  Some diarrhea  Occasionally taking imodium.  Appetite fair.  Drinking fluids without difficulty.  No nausea.  No complaint of skin irriation  Extremely fatigued.Chemotherapy last week.

## 2018-07-27 NOTE — Progress Notes (Signed)
Outpatient Oncology Nutrition Assessment  [facility]    Jill Ross  67 y.o. female  DOB: 12-Jan-1951  MRN: [id number]    Brief Nutrition Note:     Quickly visited with pt in the waiting room. She reports she's still having diarrhea, but that she has been taking Imodium every other day and that is helping. I asked if she tried the [facility] and she said that she didn't really like it in water, but is going to try it in applesauce. If she doesn't like it, she's not going to use it and she will stick with the Imodium. Appetite is good, wt is stable, pt is getting plenty of fluids. No nutrition problems currently, will f/u with pt in 2 weeks d/t moderate-low nutrition risk. RDN available PRN.       Racheal Patches, RDN  Outpatient Oncology Dietitian  [facility] [place], [place], [facility]) / [phone]  [facility] [place], [place]) / [phone]

## 2018-07-29 ENCOUNTER — Encounter: Payer: Self-pay | Admitting: Radiation Oncology

## 2018-07-29 ENCOUNTER — Encounter: Payer: Self-pay | Admitting: Hematology & Oncology

## 2018-07-29 ENCOUNTER — Ambulatory Visit: Payer: Self-pay | Admitting: Radiation Oncology

## 2018-07-29 ENCOUNTER — Ambulatory Visit
Admission: RE | Admit: 2018-07-29 | Discharge: 2018-07-29 | Disposition: A | Discharge status: Home / Self Care | Payer: No Typology Code available for payment source | Source: Ambulatory Visit | Attending: Colon & Rectal Surgery | Admitting: Colon & Rectal Surgery

## 2018-07-29 DIAGNOSIS — Z510 Encounter for antineoplastic radiation therapy: Secondary | ICD-10-CM | POA: Insufficient documentation

## 2018-07-29 DIAGNOSIS — C7982 Secondary malignant neoplasm of genital organs: Secondary | ICD-10-CM | POA: Insufficient documentation

## 2018-07-29 DIAGNOSIS — C210 Malignant neoplasm of anus, unspecified: Secondary | ICD-10-CM | POA: Insufficient documentation

## 2018-07-29 NOTE — Progress Notes (Signed)
07/29/18 1503   Radiation Therapy Patient Care - Breast    Fraction  15   Daily Dose 180   Total Dose 2700   Prescribed Dose 5400   Concurrent Therapy yes   Site anus   Comfort Alteration   Karnofsky Performance Score 90%-can perform normal activity, minor signs of disease   Fatigue 3   Pain Score 2   Pain Loc RECTUM   Nutrition Alteration   Anorexia 1-loss of appetite  (appetite "fair" )   Weight 63.5 kg (140 lb)   Skin Alteration   Radiation Dermatitis 0-none   Vital signs   Temp 98.5 F (36.9 C)   Temp Source Temporal   Heart Rate 79   Resp Rate 18   BP 111/53   Moderate fatigue  Soreness in rectum 3/10   Using aquaphor, aveeno, and Udderly smooth.

## 2018-07-29 NOTE — Progress Notes (Signed)
[place] RADIATION ONCOLOGY WEEKLY TREATMENT NOTE    Date Time:  3:16 PM 07/29/2018   Patient Name: Jill Ross, Jill Ross  Diagnosis: Anal cancer with vulvar extension    Current Dose and Fraction: 2700/5400 cGy in 15/30 fx including a SIB.    NURSING ASSESSMENT   07/29/18 1503   Radiation Therapy Patient Care - Breast    Fraction  15   Daily Dose 180   Total Dose 2700   Prescribed Dose 5400   Concurrent Therapy yes   Site anus   Comfort Alteration   Karnofsky Performance Score 90%-can perform normal activity, minor signs of disease   Fatigue 3   Pain Score 2   Pain Loc RECTUM   Nutrition Alteration   Anorexia 1-loss of appetite  (appetite "fair" )   Weight 63.5 kg (140 lb)   Skin Alteration   Radiation Dermatitis 0-none   Vital signs   Temp 98.5 F (36.9 C)   Temp Source Temporal   Heart Rate 79   Resp Rate 18   BP 111/53   Moderate fatigue  Soreness in rectum 3/10   Using aquaphor, aveeno, and Udderly smooth.    PHYSICIAN ASSESSMENT    SUBJECTIVE:   Jill Ross is a 68 y.o. female who is seen today for on-treatment clinical management during radiation therapy.     Pertinent subjective/objective findings are documented in the above flowsheet    Some rectal soreness when sitting but has chairs at home that are more comfortable. Mild fatigue.  Some diarrhea, using imodium QOD.  Appetite fair, no weight loss.  Drinking fluids without difficulty.  No nausea.  No complaint of skin irritation.  Using Udderly smooth cream, aquapor, aveeno, and sitz baths.      Objective Findings:   BP 111/53   Pulse 79   Temp 98.5 F (36.9 C) (Temporal)   Resp 18   Wt 63.5 kg (140 lb)   BMI 24.03 kg/m   Gen:NAD, sitting comfortably in chair  Anal: grade 1 radiation dermatitis    ASSESSMENT:     Tolerating treatment with expected toxicities given dose.    Prescription, setup, and dosimetry are appropriate. All port films and/or verification images have been reviewed by MD prior to next fraction and documented in [place].    Plan:   1.Continue Radiation as planned.   2. Continue skin care as instructed.   3. Continue imodium as needed.      Acie Fredrickson, MD MS  Department of Radiation Oncology  [facility]

## 2018-08-01 ENCOUNTER — Ambulatory Visit
Payer: No Typology Code available for payment source | Attending: Hematology & Oncology | Admitting: Hematology & Oncology

## 2018-08-01 ENCOUNTER — Encounter: Payer: Self-pay | Admitting: Hematology & Oncology

## 2018-08-01 ENCOUNTER — Other Ambulatory Visit (FREE_STANDING_LABORATORY_FACILITY): Payer: No Typology Code available for payment source

## 2018-08-01 VITALS — BP 110/77 | HR 99 | Temp 98.1°F | Wt 138.8 lb

## 2018-08-01 DIAGNOSIS — C210 Malignant neoplasm of anus, unspecified: Secondary | ICD-10-CM | POA: Insufficient documentation

## 2018-08-01 LAB — CBC AND DIFFERENTIAL
Absolute NRBC: 0 10*3/uL (ref 0.00–0.00)
Basophils Absolute Automated: 0.01 10*3/uL (ref 0.00–0.08)
Basophils Automated: 0.3 %
Eosinophils Absolute Automated: 0.08 10*3/uL (ref 0.00–0.44)
Eosinophils Automated: 2.1 %
Hematocrit: 32.7 % — ABNORMAL LOW (ref 34.7–43.7)
Hgb: 10.6 g/dL — ABNORMAL LOW (ref 11.4–14.8)
Immature Granulocytes Absolute: 0.02 10*3/uL (ref 0.00–0.07)
Immature Granulocytes: 0.5 %
Lymphocytes Absolute Automated: 0.27 10*3/uL — ABNORMAL LOW (ref 0.42–3.22)
Lymphocytes Automated: 7 %
MCH: 27.8 pg (ref 25.1–33.5)
MCHC: 32.4 g/dL (ref 31.5–35.8)
MCV: 85.8 fL (ref 78.0–96.0)
MPV: 9.3 fL (ref 8.9–12.5)
Monocytes Absolute Automated: 0.27 10*3/uL (ref 0.21–0.85)
Monocytes: 7 %
Neutrophils Absolute: 3.23 10*3/uL (ref 1.10–6.33)
Neutrophils: 83.1 %
Nucleated RBC: 0 /100 WBC (ref 0.0–0.0)
Platelets: 266 10*3/uL (ref 142–346)
RBC: 3.81 10*6/uL — ABNORMAL LOW (ref 3.90–5.10)
RDW: 17 % — ABNORMAL HIGH (ref 11–15)
WBC: 3.88 10*3/uL (ref 3.10–9.50)

## 2018-08-01 LAB — COMPREHENSIVE METABOLIC PANEL
ALT: 24 U/L (ref 0–55)
AST (SGOT): 22 U/L (ref 5–34)
Albumin/Globulin Ratio: 0.8 — ABNORMAL LOW (ref 0.9–2.2)
Albumin: 3.4 g/dL — ABNORMAL LOW (ref 3.5–5.0)
Alkaline Phosphatase: 110 U/L — ABNORMAL HIGH (ref 37–106)
BUN: 10 mg/dL (ref 7.0–19.0)
Bilirubin, Total: 0.4 mg/dL (ref 0.1–1.2)
CO2: 27 mEq/L (ref 21–29)
Calcium: 9.5 mg/dL (ref 8.5–10.5)
Chloride: 104 mEq/L (ref 100–111)
Creatinine: 0.8 mg/dL (ref 0.4–1.5)
Globulin: 4.2 g/dL — ABNORMAL HIGH (ref 2.0–3.7)
Glucose: 107 mg/dL — ABNORMAL HIGH (ref 70–100)
Potassium: 4.2 mEq/L (ref 3.5–5.1)
Protein, Total: 7.6 g/dL (ref 6.0–8.3)
Sodium: 140 mEq/L (ref 136–145)

## 2018-08-01 LAB — GFR: EGFR: >60

## 2018-08-01 MED ORDER — HYDROCORTISONE ACE-PRAMOXINE 1-1 % RE FOAM
1.00 | Freq: Two times a day (BID) | RECTAL | 3 refills | Status: DC
Start: 2018-08-01 — End: 2018-08-12

## 2018-08-01 NOTE — Addendum Note (Signed)
Addended by: Orpah Cobb on: 08/01/2018 01:12 PM     Modules accepted: Orders

## 2018-08-01 NOTE — Progress Notes (Addendum)
Subjective:         Anal cancer    06/20/2018 Initial Diagnosis     Anal cancer      07/05/2018 - 07/05/2018 Chemotherapy     fluorouracil (ADRUCIL) 6,920 mg in sodium chloride 0.9 % 388.4 mL chemo infusion CADD cassette, 4,000 mg/m2, Intravenous, Once - Over 96 hours, 0 of 1 cycle  mitoMYcin (MUTAMYCIN) 17.25 mg in sterile water (preservative free) 34.5 mL chemo infusion, 10 mg/m2, Intravenous, Once, 0 of 1 cycle      07/11/2018 -  Chemotherapy     fluorouracil (ADRUCIL) 6,800 mg in sodium chloride 0.9 % 250 mL chemo infusion CADD cassette, 4,000 mg/m2 = 6,800 mg, Intravenous, Once - Over 96 hours, 1 of 1 cycle  Administration: 6,800 mg (07/11/2018)  mitoMYcin (MUTAMYCIN) 17 mg in sterile water (preservative free) 34 mL chemo infusion, 10 mg/m2 = 17 mg, Intravenous, Once, 1 of 1 cycle  Administration: 17 mg (07/11/2018)               Treatment History:      Interval History:  She notes a cough since early August. She is on fraction 18 of chemo/rt.     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    She presented with one month of anal pain and pressure in August 2019. Initially, she was treated for hemorrhoids without therapeutic effect. She was seen by Dr. Sena Hitch of colorectal surgery and was found to have 5-6 cm mass in the left lateral aspect of the anus.     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 A PET scan showed an intensely hypermetabolic anal mass with likely   prolapse 4.0 x 7.4 cm maximum SUV 18.4. There was mild hypermetabolism in the inferior vagina and right vaginal cuff. There were mildly enlarged inguinal and presacral lymph nodes with only very mild hypermetabolism (mostly with SUV under 3).    She started treatment in early October.      PMH:  Anal fissure  High cholesterol    Fam Hx:  Prostate cancer in 39s  Mother with cancer in 90s.    Social Hx:  Never smoker      Review of Systems   General ROS: negative for  fatigue or weight loss  or anorexia  Respiratory ROS: no cough, shortness of breath, or wheezing  Gastrointestinal ROS: negative for - abdominal pain, constipation, diarrhea or nausea/vomiting  Head and Neck ROS: no thrush or mucositis  Cardiovascular ROS: No chest pain or palpitations  Derm ROS: No new rashes  CNS: Negative for new focal weakness or numbness.  Extremities: Negative worsening edema or cyanosis.  GU: Negative for incontinence of Dysuria  Skeleton: Negative for difficulty with ambulation or focal bone pain    The remainder of the patient's 14 point review of symptoms is negative       Objective:     Wt Readings from Last 3 Encounters:   08/01/18 63 kg (138 lb 12.8 oz)   07/29/18 63.5 kg (140 lb)   07/22/18 64.5 kg (142 lb 3.2 oz)   BP 110/77   Pulse 99   Temp 98.1 F (36.7 C) (Oral)   Wt 63 kg (138 lb 12.8 oz)   BMI 23.82 kg/m   Neck - supple, no significant adenopathy  Lymphatics - no palpable lymphadenopathy, no hepatosplenomegaly  Chest - clear to auscultation, no wheezes, rales or rhonchi, symmetric air entry  Heart -  normal rate, regular rhythm, normal S1, S2, no murmurs, rubs, clicks or gallops  Abdomen - soft, nontender, nondistended, no masses or organomegaly  Extremities - peripheral pulses normal, no pedal edema, no clubbing or cyanosis, no pedal edema noted,no clubbing or erythema  Skin - normal coloration and turgor, no rashes, no suspicious skin lesions noted   rectal exam: hemorrhoids; no residual mass appreciated. There was significant inflammation/proctitis seen extending anteriorly on the perineum.    Rads:     No results found.         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 She is tolerating it well.  -CBC, CMP today.    Tx plan :  5-FU 1000 mg/m2/day continuous infusion over 24 hours per day for four days (96 hours) on days 1-4 and again on days 29-32.  Mitomycin 10 mg/m2 on day 1 only  She will receive treatment again  on 11/11.    Radiaton Proctitis.  Trial of proctofoam in addition to anusol.    Goals of care:  Curative

## 2018-08-01 NOTE — Addendum Note (Signed)
Addended by: Legrand Como on: 08/01/2018 01:15 PM     Modules accepted: Orders

## 2018-08-03 ENCOUNTER — Encounter: Payer: Self-pay | Admitting: Hematology & Oncology

## 2018-08-05 ENCOUNTER — Encounter: Payer: Self-pay | Admitting: Radiation Oncology

## 2018-08-05 ENCOUNTER — Ambulatory Visit: Payer: Self-pay | Admitting: Radiation Oncology

## 2018-08-05 MED ORDER — SILVER SULFADIAZINE 1 % EX CREA
TOPICAL_CREAM | Freq: Two times a day (BID) | CUTANEOUS | 1 refills | Status: DC
Start: 2018-08-05 — End: 2019-03-07

## 2018-08-05 MED ORDER — LIDOCAINE (ANORECTAL) 5 % EX CREA
TOPICAL_CREAM | CUTANEOUS | 2 refills | Status: DC
Start: 2018-08-05 — End: 2019-03-07

## 2018-08-05 NOTE — Progress Notes (Signed)
[place] RADIATION ONCOLOGY WEEKLY TREATMENT NOTE    Date Time:  3:09 PM 08/05/2018   Patient Name: Jill Ross, Jill Ross  Diagnosis: Anal cancer with vulvar extension    Current Dose and Fraction: 3600/5400 cGy in 20/30 fx including a SIB.    NURSING ASSESSMENT   08/05/18 1446   Radiation Therapy Patient Care - Pelvis, Female   Fraction  20   Daily Dose 180   Total Dose 3600   Prescribed Dose 5400   Concurrent Therapy yes   Site anus   Comfort Alteration   Karnofsky Performance Score 90%-can perform normal activity, minor signs of disease   Fatigue 2   Pain Score 0   Nutrition Alteration   Anorexia 1-loss of appetite   Nausea 0-none   Vomiting 0-none   Weight 62.7 kg (138 lb 3.2 oz)   Elimination Alteration   Constipation 0 - None   Diarrhea 1 - Increase of < 4 stools/day over baseline pretreatment   Urinary Frequency, Urgency 0-normal   Urinary Incontinence 0-absent   Dysuria 0-none   Skin Alteration   Skin Care Other  (desitin)   Vital signs   Temp 97.9 F (36.6 C)   Temp Source Temporal   Heart Rate 74   Resp Rate 18   BP 133/67   Some fatigue.   Diarrhea primarily in the mornings 4-5 BM's then none for the rest of the day.   Appetite "fair"  Using Desitin on skin.       PHYSICIAN ASSESSMENT    SUBJECTIVE:   Jill Ross is a 67 y.o. female who is seen today for on-treatment clinical management during radiation therapy.     Pertinent subjective/objective findings are documented in the above flowsheet    Some rectal soreness when sitting but has chairs at home that are more comfortable. Mild fatigue.  Diarrhea 4-5X/day, using 1 imodium QOD.  Appetite fair, no weight loss.  Drinking fluids without difficulty.  No nausea.  No complaint of skin irritation.  Using Udderly smooth cream, aquapor, aveeno, and sitz baths.      Objective Findings:   BP 133/67   Pulse 74   Temp 97.9 F (36.6 C) (Temporal)   Resp 18   Wt 62.7 kg (138 lb 3.2 oz)   BMI 23.72 kg/m   Gen:NAD, sitting comfortably in chair  Anal: grade 2  radiation dermatitis, desquamation extending from peri-anal area to vulva but not continuous.    ASSESSMENT:     Tolerating treatment with expected toxicities given dose.    Prescription, setup, and dosimetry are appropriate. All port films and/or verification images have been reviewed by MD prior to next fraction and documented in [place].    Plan:  1.Continue Radiation as planned.   2. Continue skin care as instructed. Rx for lidocaine 5% and silvadene given.    3. Continue imodium as needed.      Acie Fredrickson, MD MS  Department of Radiation Oncology  [facility]

## 2018-08-05 NOTE — Progress Notes (Signed)
08/05/18 1446   Radiation Therapy Patient Care - Pelvis, Female   Fraction  20   Daily Dose 180   Total Dose 3600   Prescribed Dose 5400   Concurrent Therapy yes   Site anus   Comfort Alteration   Karnofsky Performance Score 90%-can perform normal activity, minor signs of disease   Fatigue 2   Pain Score 0   Nutrition Alteration   Anorexia 1-loss of appetite   Nausea 0-none   Vomiting 0-none   Weight 62.7 kg (138 lb 3.2 oz)   Elimination Alteration   Constipation 0 - None   Diarrhea 1 - Increase of < 4 stools/day over baseline pretreatment   Urinary Frequency, Urgency 0-normal   Urinary Incontinence 0-absent   Dysuria 0-none   Skin Alteration   Skin Care Other  (desitin)   Vital signs   Temp 97.9 F (36.6 C)   Temp Source Temporal   Heart Rate 74   Resp Rate 18   BP 133/67   Some fatigue.   Diarrhea primarily in the mornings 4-5 BM's then none for the rest of the day.   Appetite "fair"  Using Desitin on skin.

## 2018-08-08 ENCOUNTER — Ambulatory Visit: Payer: No Typology Code available for payment source | Attending: Hematology & Oncology

## 2018-08-08 ENCOUNTER — Ambulatory Visit: Payer: No Typology Code available for payment source | Admitting: Family

## 2018-08-08 ENCOUNTER — Ambulatory Visit: Payer: No Typology Code available for payment source

## 2018-08-08 ENCOUNTER — Encounter: Payer: Self-pay | Admitting: Family

## 2018-08-08 VITALS — BP 109/72 | HR 76 | Temp 97.6°F | Wt 137.5 lb

## 2018-08-08 VITALS — BP 106/69 | HR 101 | Temp 98.1°F | Ht 64.0 in | Wt 137.8 lb

## 2018-08-08 DIAGNOSIS — C210 Malignant neoplasm of anus, unspecified: Secondary | ICD-10-CM

## 2018-08-08 DIAGNOSIS — Z452 Encounter for adjustment and management of vascular access device: Secondary | ICD-10-CM | POA: Insufficient documentation

## 2018-08-08 LAB — CBC AND DIFFERENTIAL
Absolute NRBC: 0 10*3/uL (ref 0.00–0.00)
Basophils Absolute Automated: 0.04 10*3/uL (ref 0.00–0.08)
Basophils Automated: 1.2 %
Eosinophils Absolute Automated: 0.31 10*3/uL (ref 0.00–0.44)
Eosinophils Automated: 9.3 %
Hematocrit: 31 % — ABNORMAL LOW (ref 34.7–43.7)
Hgb: 10.1 g/dL — ABNORMAL LOW (ref 11.4–14.8)
Immature Granulocytes Absolute: 0.02 10*3/uL (ref 0.00–0.07)
Immature Granulocytes: 0.6 %
Lymphocytes Absolute Automated: 0.29 10*3/uL — ABNORMAL LOW (ref 0.42–3.22)
Lymphocytes Automated: 8.7 %
MCH: 28 pg (ref 25.1–33.5)
MCHC: 32.6 g/dL (ref 31.5–35.8)
MCV: 85.9 fL (ref 78.0–96.0)
MPV: 9.7 fL (ref 8.9–12.5)
Monocytes Absolute Automated: 0.41 10*3/uL (ref 0.21–0.85)
Monocytes: 12.3 %
Neutrophils Absolute: 2.25 10*3/uL (ref 1.10–6.33)
Neutrophils: 67.9 %
Nucleated RBC: 0 /100 WBC (ref 0.0–0.0)
Platelets: 363 10*3/uL — ABNORMAL HIGH (ref 142–346)
RBC: 3.61 10*6/uL — ABNORMAL LOW (ref 3.90–5.10)
RDW: 19 % — ABNORMAL HIGH (ref 11–15)
WBC: 3.32 10*3/uL (ref 3.10–9.50)

## 2018-08-08 LAB — COMPREHENSIVE METABOLIC PANEL
ALT: 20 U/L (ref 0–55)
AST (SGOT): 20 U/L (ref 5–34)
Albumin/Globulin Ratio: 0.9 (ref 0.9–2.2)
Albumin: 3.4 g/dL — ABNORMAL LOW (ref 3.5–5.0)
Alkaline Phosphatase: 91 U/L (ref 37–106)
BUN: 7 mg/dL (ref 7.0–19.0)
Bilirubin, Total: 0.4 mg/dL (ref 0.1–1.2)
CO2: 25 mEq/L (ref 21–29)
Calcium: 9.2 mg/dL (ref 8.5–10.5)
Chloride: 106 mEq/L (ref 100–111)
Creatinine: 0.8 mg/dL (ref 0.4–1.5)
Globulin: 3.8 g/dL — ABNORMAL HIGH (ref 2.0–3.7)
Glucose: 98 mg/dL (ref 70–100)
Potassium: 4.1 mEq/L (ref 3.5–5.1)
Protein, Total: 7.2 g/dL (ref 6.0–8.3)
Sodium: 139 mEq/L (ref 136–145)

## 2018-08-08 LAB — GFR: EGFR: >60

## 2018-08-08 MED ORDER — LORAZEPAM 2 MG/ML IJ SOLN
0.5000 mg | INTRAMUSCULAR | Status: DC | PRN
Start: 2018-08-08 — End: 2018-08-12

## 2018-08-08 MED ORDER — LORAZEPAM 1 MG PO TABS
1.0000 mg | ORAL_TABLET | Freq: Once | ORAL | Status: DC | PRN
Start: 2018-08-08 — End: 2018-08-12

## 2018-08-08 MED ORDER — FAMOTIDINE 10 MG/ML IV SOLN (WRAP)
20.00 mg | Freq: Once | INTRAVENOUS | Status: DC | PRN
Start: 2018-08-08 — End: 2018-08-12

## 2018-08-08 MED ORDER — EPINEPHRINE HCL 1 MG/ML IJ SOLN (WRAP)
0.30 mg | Freq: Once | Status: DC | PRN
Start: 2018-08-08 — End: 2018-08-12

## 2018-08-08 MED ORDER — FLUOROURACIL 5 GM/100ML IV SOLN
4000.00 mg/m2 | Freq: Once | INTRAVENOUS | Status: AC
Start: 2018-08-08 — End: 2018-08-12
  Administered 2018-08-08: 11:00:00 6800 mg via INTRAVENOUS
  Filled 2018-08-08: qty 136

## 2018-08-08 MED ORDER — HEPARIN SOD (PORK) LOCK FLUSH 100 UNIT/ML IV SOLN
5.00 mL | INTRAVENOUS | Status: DC | PRN
Start: 2018-08-08 — End: 2018-08-12

## 2018-08-08 MED ORDER — DIPHENHYDRAMINE HCL 50 MG/ML IJ SOLN
25.00 mg | Freq: Once | INTRAMUSCULAR | Status: DC | PRN
Start: 2018-08-08 — End: 2018-08-12

## 2018-08-08 MED ORDER — SODIUM CHLORIDE (PF) 0.9 % IJ SOLN
5.00 mL | INTRAMUSCULAR | Status: DC | PRN
Start: 2018-08-08 — End: 2018-08-12

## 2018-08-08 MED ORDER — SODIUM CHLORIDE 0.9 % IV BOLUS
500.00 mL | Freq: Once | INTRAVENOUS | Status: DC | PRN
Start: 2018-08-08 — End: 2018-08-12

## 2018-08-08 MED ORDER — SODIUM CHLORIDE 0.9 % IV SOLN
25.00 mL/h | INTRAVENOUS | Status: DC | PRN
Start: 2018-08-08 — End: 2018-08-12

## 2018-08-08 MED ORDER — DIPHENHYDRAMINE HCL 50 MG/ML IJ SOLN
50.00 mg | Freq: Once | INTRAMUSCULAR | Status: DC | PRN
Start: 2018-08-08 — End: 2018-08-12

## 2018-08-08 MED ORDER — SODIUM CHLORIDE 0.9 % IV SOLN
250.00 mL | INTRAVENOUS | Status: AC
Start: 2018-08-08 — End: 2018-08-08
  Administered 2018-08-08: 10:00:00 250 mL via INTRAVENOUS

## 2018-08-08 MED ORDER — HYDROCORTISONE SOD SUC (PF) 100 MG IJ SOLR (WRAP)
100.00 mg | Freq: Once | INTRAMUSCULAR | Status: DC | PRN
Start: 2018-08-08 — End: 2018-08-12

## 2018-08-08 MED ORDER — ALBUTEROL SULFATE (2.5 MG/3ML) 0.083% IN NEBU
2.50 mg | INHALATION_SOLUTION | Freq: Once | RESPIRATORY_TRACT | Status: DC | PRN
Start: 2018-08-08 — End: 2018-08-12

## 2018-08-08 MED ORDER — ONDANSETRON IVPB 8 MG IN NS 50 ML
8.00 mg | Freq: Once | INTRAVENOUS | Status: AC
Start: 2018-08-08 — End: 2018-08-08
  Administered 2018-08-08: 10:00:00 8 mg via INTRAVENOUS
  Filled 2018-08-08: qty 50

## 2018-08-08 NOTE — Progress Notes (Signed)
[facility] - Adult Infusion   Visit Date: 08/08/2018    Jill Ross is a 67 y.o. female patient of Orpah Cobb, MD    Since her last visit, she has been doing well.   Patient presents to the infusion center for Cycle 1, Day 29.   Overall, she states her energy level is good.  The patient is able to perform most usual functions.. Her appetite is reported to be intact, with no significant changes in weight.. She reports no complaints. She has no other concerns. There are no social work or other referral needs at this time.    Diagnosis: Anal cancer    Treatment on Clinical Trial: Not On Study (NOS)  [facility](O) - OP Adult - Anal - Fluorouracil + MitoMYcin (Concurrent w/XRT)    IV Pre Hydration: N/A    Urine Parameters: N/A    Pre-Medications: ondansetron 8 mg IVPB.    Line Type: Mediport,    Right Port     Blood Return verified prior to administration: Yes    Vital Signs:    Patient Vitals for the past 12 hrs:   BP Temp Pulse   08/08/18 1004 106/69 98.1 F (36.7 C) (!) 101      Body surface area is 1.68 meters squared.    '  Chemistry        Component Value Date/Time    NA 139 08/08/2018 0830    K 4.1 08/08/2018 0830    CL 106 08/08/2018 0830    CO2 25 08/08/2018 0830    BUN 7.0 08/08/2018 0830    CREAT 0.8 08/08/2018 0830    GLU 98 08/08/2018 0830        Component Value Date/Time    CA 9.2 08/08/2018 0830    ALKPHOS 110 (H) 08/01/2018 1330    AST 20 08/08/2018 0830    ALT 20 08/08/2018 0830    BILITOTAL 0.4 08/08/2018 0830          Lab Results   Component Value Date    WBC 3.32 08/08/2018    HGB 10.1 (L) 08/08/2018    PLT 363 (H) 08/08/2018    NEUTROABS 2.25 08/08/2018     Chemo Given within the past 12 hours:   "Chemo/Biotherapy Given" (last 12 hours)     Date/Time Action User Medication Dose Rate Dose Volume (mL) Rate    08/08/18 1116 New Bag    MM fluorouracil (ADRUCIL) 6,800 mg in sodium chloride 0.9 % 250 mL chemo infusion CADD cassette 6,800 mg 2.6 mL/hr           Hypersensitivity  Reaction Noted: No    IV Post Hydration: No, not required    Treatment Outcome:Patient tolerated treatment well. Central Line flushed and fluorouracil CADD attached.    Education: Patient/Family educated regarding the expected outcomes/side effects possible interactions of treatments. Patient verbalizes understanding.    Follow-up Plan: Discharged in stable condition. Accompanied by, spouse. Instructed to contact physician if any complications or unmanageable symptoms occur.      RTC on 12-Aug-2018 at 1145.    Hurley Sobel Stevphen Rochester, RN    08/08/2018  12:38 PM

## 2018-08-08 NOTE — Progress Notes (Addendum)
Patient here for port access and lab work prior to scheduled treatment. Right mediport accessed with 20 gauge 3/4" huber needle and flushed with 10cc NS with positive blood return. CBC. CMP collected. Port flushed with 10 cc NS and curos cap applied. Tolerated without incident. Sent to infusion clinic for treatment.

## 2018-08-08 NOTE — Progress Notes (Signed)
Subjective:         Anal cancer    06/20/2018 Initial Diagnosis     Anal cancer      07/05/2018 - 07/05/2018 Chemotherapy     fluorouracil (ADRUCIL) 6,920 mg in sodium chloride 0.9 % 388.4 mL chemo infusion CADD cassette, 4,000 mg/m2, Intravenous, Once - Over 96 hours, 0 of 1 cycle  mitoMYcin (MUTAMYCIN) 17.25 mg in sterile water (preservative free) 34.5 mL chemo infusion, 10 mg/m2, Intravenous, Once, 0 of 1 cycle      07/11/2018 -  Chemotherapy     fluorouracil (ADRUCIL) 6,800 mg in sodium chloride 0.9 % 250 mL chemo infusion CADD cassette, 4,000 mg/m2 = 6,800 mg, Intravenous, Once - Over 96 hours, 1 of 1 cycle  Administration: 6,800 mg (07/11/2018)  mitoMYcin (MUTAMYCIN) 17 mg in sterile water (preservative free) 34 mL chemo infusion, 10 mg/m2 = 17 mg, Intravenous, Once, 1 of 1 cycle  Administration: 17 mg (07/11/2018)               Treatment History:      Interval History:  Jill Ross is a 67 year old female with stage IIB anal cancer on cycle 1 Day 29 of 5-FU/Mitomycin and concurrent RT. She has 2 more weeks of scheduled RT. She is tolerating this regimen well. She states she has worsening anal pain (3/10), taking sitz baths daily, silvadene bid, lidocaine bid, Tramadol PRN. She continues to have loose stools, 5 episodes yesterday. Taking Imodium provides relief.    She does have nausea after chemotherapy, she is eating "fair".  Denies abdominal pain, hematochezia, and shortness of breath.        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    She presented with one month of anal pain and pressure in August 2019. Initially, she was treated for hemorrhoids without therapeutic effect. She was seen by Dr. Sena Hitch of colorectal surgery and was found to have 5-6 cm mass in the left lateral aspect of the anus.     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 A PET scan showed an intensely hypermetabolic anal mass with likely   prolapse 4.0 x 7.4 cm maximum  SUV 18.4. There was mild hypermetabolism in the inferior vagina and right vaginal cuff. There were mildly enlarged inguinal and presacral lymph nodes with only very mild hypermetabolism (mostly with SUV under 3).    She started treatment in early October.      Review of Systems   General ROS: (-) Fatigue, weight loss, or anorexia  Respiratory ROS: (-) Cough, shortness of breath, or wheezing  Gastrointestinal ROS: Anal pain, loose stools, nausea. (-) Abdominal pain, constipation, or vomiting  Head and Neck ROS: (-) Thrush or mucositis  Cardiovascular ROS: (-) Chest pain or palpitations  Derm ROS: (-) New rashes  CNS: (-) New focal weakness or numbness.  Extremities: (-) Worsening edema or cyanosis.  GU: (-) Incontinence, dysuria, or nocturia  Skeleton: (-) Difficulty with ambulation or focal bone pain    The remainder of the patient's 12 point review of symptoms is negative       Objective:     Wt Readings from Last 3 Encounters:   08/08/18 62.4 kg (137 lb 8 oz)   08/05/18 62.7 kg (138 lb 3.2 oz)   08/01/18 63 kg (138 lb 12.8 oz)     Vitals:    08/08/18 0910   BP: 109/72   Pulse: 76   Temp:  97.6 F (36.4 C)   SpO2: 98%       Neck: Supple, no significant adenopathy  Lymphatics: No palpable lymphadenopathy or hepatosplenomegaly  Chest: Clear to auscultation, no wheezes, rales or rhonchi, symmetric air entry  Heart: Normal rate, regular rhythm, normal S1, S2, no murmurs  Abdomen: Soft, nontender, nondistended, no masses or organomegaly. Bowel sounds normo-active x4 quadrants  Extremities: No pedal edema, clubbing, cyanosis, or erythema. Peripheral pulses normal  Skin:  No rashes or suspicious skin lesions noted. Normal coloration and turgor       Rads:     No results found.         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  -Continue 5-FU (days 1-4, 29-32) /Mitomycin (Day 1 only)  -2 more weeks of  RT  -CBC and CMP today.    Radiation proctitis/pain  -Daily Sitz baths  -Silavadene Cream BID  -Lidocaine 5%  -Tramadol 50mg    -(not taking Proctofoam due to high cost)    Loose stools  -Imodium, up to 16mg  daily    Goals of care:  Curative

## 2018-08-11 ENCOUNTER — Encounter: Payer: Self-pay | Admitting: Hematology & Oncology

## 2018-08-12 ENCOUNTER — Ambulatory Visit: Payer: No Typology Code available for payment source | Attending: Hematology & Oncology

## 2018-08-12 ENCOUNTER — Ambulatory Visit: Payer: Self-pay | Admitting: Radiation Oncology

## 2018-08-12 VITALS — BP 110/72 | HR 93 | Temp 98.9°F | Ht 64.0 in | Wt 139.7 lb

## 2018-08-12 DIAGNOSIS — Z452 Encounter for adjustment and management of vascular access device: Secondary | ICD-10-CM | POA: Insufficient documentation

## 2018-08-12 DIAGNOSIS — C210 Malignant neoplasm of anus, unspecified: Secondary | ICD-10-CM | POA: Insufficient documentation

## 2018-08-12 MED ORDER — ALBUTEROL SULFATE (2.5 MG/3ML) 0.083% IN NEBU
2.50 mg | INHALATION_SOLUTION | Freq: Once | RESPIRATORY_TRACT | Status: DC | PRN
Start: 2018-08-12 — End: 2018-08-12

## 2018-08-12 MED ORDER — SODIUM CHLORIDE (PF) 0.9 % IJ SOLN
5.00 mL | INTRAMUSCULAR | Status: DC | PRN
Start: 2018-08-12 — End: 2018-08-12
  Administered 2018-08-12: 13:00:00 10 mL via INTRAVENOUS

## 2018-08-12 MED ORDER — DIPHENHYDRAMINE HCL 50 MG/ML IJ SOLN
50.00 mg | Freq: Once | INTRAMUSCULAR | Status: DC | PRN
Start: 2018-08-12 — End: 2018-08-12

## 2018-08-12 MED ORDER — HEPARIN SOD (PORK) LOCK FLUSH 100 UNIT/ML IV SOLN
5.00 mL | INTRAVENOUS | Status: DC | PRN
Start: 2018-08-12 — End: 2018-08-12
  Administered 2018-08-12: 13:00:00 5 mL

## 2018-08-12 MED ORDER — SODIUM CHLORIDE 0.9 % IV BOLUS
500.00 mL | Freq: Once | INTRAVENOUS | Status: DC | PRN
Start: 2018-08-12 — End: 2018-08-12

## 2018-08-12 MED ORDER — SODIUM CHLORIDE 0.9 % IV SOLN
25.00 mL/h | INTRAVENOUS | Status: DC | PRN
Start: 2018-08-12 — End: 2018-08-12

## 2018-08-12 MED ORDER — LORAZEPAM 2 MG/ML IJ SOLN
0.5000 mg | INTRAMUSCULAR | Status: DC | PRN
Start: 2018-08-12 — End: 2018-08-12

## 2018-08-12 MED ORDER — LORAZEPAM 1 MG PO TABS
1.0000 mg | ORAL_TABLET | Freq: Once | ORAL | Status: DC | PRN
Start: 2018-08-12 — End: 2018-08-12

## 2018-08-12 MED ORDER — HYDROCORTISONE SOD SUC (PF) 100 MG IJ SOLR (WRAP)
100.00 mg | Freq: Once | INTRAMUSCULAR | Status: DC | PRN
Start: 2018-08-12 — End: 2018-08-12

## 2018-08-12 MED ORDER — DIPHENHYDRAMINE HCL 50 MG/ML IJ SOLN
25.00 mg | Freq: Once | INTRAMUSCULAR | Status: DC | PRN
Start: 2018-08-12 — End: 2018-08-12

## 2018-08-12 MED ORDER — FAMOTIDINE 10 MG/ML IV SOLN (WRAP)
20.00 mg | Freq: Once | INTRAVENOUS | Status: DC | PRN
Start: 2018-08-12 — End: 2018-08-12

## 2018-08-12 MED ORDER — SILVER SULFADIAZINE 1 % EX CREA
TOPICAL_CREAM | Freq: Three times a day (TID) | CUTANEOUS | 1 refills | Status: DC
Start: 2018-08-12 — End: 2019-03-07

## 2018-08-12 MED ORDER — EPINEPHRINE HCL 1 MG/ML ADULT ANAPHYLAXIS KIT
0.30 mg | Freq: Once | INTRAMUSCULAR | Status: DC | PRN
Start: 2018-08-12 — End: 2018-08-12

## 2018-08-12 NOTE — Progress Notes (Signed)
[facility]  Nutrition Follow Up       Jill Ross  67 y.o. female  DOB:09-17-1951  ID: [id number]     Summary: Pt has completed 25/30 fractions radiation to pelvis d/t anal CA. She finished her last chemo today and reports her appetite has improved since a month ago. She is now eating at least one "real" meal per day and continues to snack outside of that meal. Reports no distressful symptoms to me today, everything is manageable.     Plan/Nutrition Intervention:  Provided positive reinforcement for finding what works for her and having a successful treatment path so far.     Assessment:    Past Medical History:  has a past medical history of Dry cough (Since 04/2018), Gall stones, Hepatitis (1963), and Malignant neoplasm (04/2018).    Symptoms: none noted     Diet: regular    Exercise: active with routine activities    Additional Questions & Concerns: NA    Anthropometrics:  Estimated body mass index is 23.98 kg/m as calculated from the following:    Height as of an earlier encounter on 08/12/18: 1.626 m (5\' 4" ).    Weight as of an earlier encounter on 08/12/18: 63.4 kg (139 lb 11.2 oz).    UBW: 155 lb = 90%    Nutrition Focused Physical Exam: appears well nourished     Weight History:  Wt Readings from Last 10 Encounters:   08/12/18 63.4 kg (139 lb 11.2 oz)   08/08/18 62.5 kg (137 lb 12.8 oz)   08/08/18 62.4 kg (137 lb 8 oz)   08/05/18 62.7 kg (138 lb 3.2 oz)   08/01/18 63 kg (138 lb 12.8 oz)   07/29/18 63.5 kg (140 lb)   07/22/18 64.5 kg (142 lb 3.2 oz)   07/21/18 63.5 kg (139 lb 14.4 oz)   07/15/18 64.9 kg (143 lb 1.6 oz)   07/15/18 62 kg (136 lb 11.2 oz)       Labs:   Lab Results   Component Value Date    NA 139 08/08/2018    K 4.1 08/08/2018    CL 106 08/08/2018    CO2 25 08/08/2018    BUN 7.0 08/08/2018    CREAT 0.8 08/08/2018    GLU 98 08/08/2018    CA 9.2 08/08/2018    MG 2.1 07/21/2018    EGFR >60.0 08/08/2018    WBC 3.32 08/08/2018    HCT 31.0 (L) 08/08/2018    HGB 10.1 (L)  08/08/2018    AMY 66 11/02/2014    LIP 28 11/02/2014       Estimated Nutrition Needs (based on 06/24/18 weight):  Calories: 1769-2096Kcal/day (27-32Kcal/kg using wt of 65.5kg)   Protein: 66-79gm/day (1-1.2gm/kg using wt of 65.5kg)   Fluid: 1769-2076mL/day (1 mL/Kcal)     Nutrition Diagnosis:    Inadequate oral intake resolved  Food and nutrition-related knowledge deficit ongoing    Plan/Nutrition Intervention:  Provided positive reinforcement for finding what works for her and having a successful treatment path so far.     Goals:  1) Maintain current wt within 5% during treatment and recovery. Met.   2) Consume greater than 75% estimated nutrition needs. Met.   3) Treat side effects to be able to consume adequate calories/protein per above. Met.       Follow Up: PRN d/t low nutrition risk, pt is done next week and has my contact information for nutrition-related questions and concerns during treatment  and recovery.       Racheal Patches, RDN  Outpatient Oncology Dietitian  [facility] [place], [place], [facility]) / [phone]  [facility]iation and Oncology [facility] [place], [place]) / [phone]

## 2018-08-12 NOTE — Progress Notes (Signed)
[place] RADIATION ONCOLOGY WEEKLY TREATMENT NOTE    Date Time:  3:29 PM 08/12/2018   Patient Name: Jill Ross, Jill Ross  Diagnosis: Anal cancer with vulvar extension    Current Dose and Fraction: 4500/5400 cGy in 25/30 fx including a SIB.    NURSING ASSESSMENT   08/12/18 1516   Radiation Therapy Patient Care - Pelvis, Female   Fraction  25   Daily Dose 180   Total Dose 4500   Prescribed Dose 5400   Site anus   Comfort Alteration   Karnofsky Performance Score 100%-normal, no complaints   Fatigue 3   Pain Score 4   Pain Loc RECTUM   Pain Intervention 4-adjuvant medication ([name]. neuroleptics, amitriptyline, carbamazepine)   Effectiveness of Pain Intervention 3-pain relieved 75%   Nutrition Alteration   Anorexia 1-loss of appetite   Nausea 0-none   Vomiting 0-none   Weight 63.1 kg (139 lb 3.2 oz)   Elimination Alteration   Constipation 0 - None   Diarrhea 0-none   Urinary Frequency, Urgency 0-normal   Urinary Incontinence 0-absent   Vital signs   Temp 98.8 F (37.1 C)   Temp Source Temporal   Heart Rate 88   Resp Rate 18   BP 117/68   Using silvadene and tramadol for pain.  Using sitz bath.    Usually 3 BMs daily - consistency just about right.  Takes imodium if need be.  No changes in urinary symptoms.  Moderately fatigued.  No nausea.  Some loss of appetite.                   PHYSICIAN ASSESSMENT    SUBJECTIVE:   Jill Ross is a 67 y.o. female who is seen today for on-treatment clinical management during radiation therapy.     Pertinent subjective/objective findings are documented in the above flowsheet    Some rectal soreness when sitting but has chairs at home that are more comfortable. Mild fatigue.  Diarrhea 2-3X/day, using 1 imodium QOD.  Appetite fair, no weight loss.  Drinking fluids without difficulty.  No nausea.  No complaint of skin irritation.  Using Udderly smooth cream, aquapor, aveeno, and sitz baths.  Using silvadene and tramadol for pain. Hasn't used the lidocaine 5% cream as much.    Objective  Findings:   BP 117/68   Pulse 88   Temp 98.8 F (37.1 C) (Temporal)   Resp 18   Wt 63.1 kg (139 lb 3.2 oz)   BMI 23.89 kg/m   Gen:NAD, sitting comfortably in chair  Anal: grade 3 radiation dermatitis, increased moist desquamation extending from peri-anal area to vulva that is now continuous.    ASSESSMENT:     Tolerating treatment with expected toxicities given dose.    Prescription, setup, and dosimetry are appropriate. All port films and/or verification images have been reviewed by MD prior to next fraction and documented in [place].    Plan:  1.Hold Radiation Monday-Tuesday, restart Wednesday  2. Continue skin care as instructed. Continue silvadene and tramadol for pain.  3. Continue imodium as needed.      Acie Fredrickson, MD MS  Department of Radiation Oncology  [facility]

## 2018-08-12 NOTE — Progress Notes (Signed)
Time: 1220    Jill Ross 67 y.o. female here for chemo pump disconnected.      Disconnected the pump, flush the mediport with 10cc NS ( positive blood return noted), 500unit/5cc heparin then deaccessed the port intact.    Pt discharged stable, NAD.      Catalina Pizza, RN

## 2018-08-12 NOTE — Progress Notes (Signed)
08/12/18 1516   Radiation Therapy Patient Care - Pelvis, Female   Fraction  25   Daily Dose 180   Total Dose 4500   Prescribed Dose 5400   Site anus   Comfort Alteration   Karnofsky Performance Score 100%-normal, no complaints   Fatigue 3   Pain Score 4   Pain Loc RECTUM   Pain Intervention 4-adjuvant medication (e.g. neuroleptics, amitriptyline, carbamazepine)   Effectiveness of Pain Intervention 3-pain relieved 75%   Nutrition Alteration   Anorexia 1-loss of appetite   Nausea 0-none   Vomiting 0-none   Weight 63.1 kg (139 lb 3.2 oz)   Elimination Alteration   Constipation 0 - None   Diarrhea 0-none   Urinary Frequency, Urgency 0-normal   Urinary Incontinence 0-absent   Vital signs   Temp 98.8 F (37.1 C)   Temp Source Temporal   Heart Rate 88   Resp Rate 18   BP 117/68   Using silvadene and tramadol for pain.  Using sitz bath.    Usually 3 BMs daily - consistency just about right.  Takes imodium if need be.  No changes in urinary symptoms.  Moderately fatigued.  No nausea.  Some loss of appetite.

## 2018-08-19 ENCOUNTER — Ambulatory Visit: Payer: Self-pay | Admitting: Radiation Oncology

## 2018-08-19 ENCOUNTER — Encounter: Payer: Self-pay | Admitting: Radiation Oncology

## 2018-08-19 MED ORDER — TRAMADOL HCL 50 MG PO TABS
50.0000 mg | ORAL_TABLET | Freq: Four times a day (QID) | ORAL | 0 refills | Status: DC | PRN
Start: 2018-08-19 — End: 2018-09-16

## 2018-08-19 NOTE — Progress Notes (Signed)
[place] RADIATION ONCOLOGY WEEKLY TREATMENT NOTE    Date Time:  3:24 PM 08/19/2018   Patient Name: Jill Ross, Jill Ross  Diagnosis: Anal cancer with vulvar extension    Current Dose and Fraction: 5040/5400 cGy in 28/30 fx including a SIB.    NURSING ASSESSMENT   08/19/18 1439   Radiation Therapy Patient Care - Pelvis, Female   Fraction  28   Daily Dose 180   Total Dose 5040   Prescribed Dose 5400   Planned Boost  No   Site anus   Comfort Alteration   Karnofsky Performance Score 100%-normal, no complaints   Fatigue 3   Pain Score 5   Pain Loc RECTUM   Pain Intervention 4-adjuvant medication ([name]. neuroleptics, amitriptyline, carbamazepine)   Effectiveness of Pain Intervention 3-pain relieved 75%   Nutrition Alteration   Anorexia 1-loss of appetite   Nausea 0-none   Vomiting 0-none   Weight 62.1 kg (136 lb 12.8 oz)   Elimination Alteration   Constipation 0 - None   Diarrhea 1 - Increase of < 4 stools/day over baseline pretreatment   Urinary Frequency, Urgency 0-normal   Urinary Incontinence 0-absent   Dysuria 0-none   Skin Alteration   Radiation Dermatitis 2-moderate to brisk erythemia or patchy moist desquamation, mostly confined to skin folds and creases, or moderate edema   Vital signs   Temp 98.5 F (36.9 C)   Temp Source Temporal   Heart Rate 94   Resp Rate 18   BP 132/60   Fall Risk           Moderate      Doing fairly well.  Pain in the rectal area and skin irritated when comes in contact with urine.  Appetite is fair, occasional diarrhea, no constipation.  Suggested a peri bottle to dilute urine and Sitz bath to soothe rectal area.        PHYSICIAN ASSESSMENT    SUBJECTIVE:   Jill Ross is a 67 y.o. female who is seen today for on-treatment clinical management during radiation therapy.     Pertinent subjective/objective findings are documented in the above flowsheet    Some rectal soreness when sitting but has chairs at home that are more comfortable. Mild fatigue.  Diarrhea 2-3X/day, using 1 imodium  QOD.  Appetite fair, no weight loss.  Drinking fluids without difficulty.  No nausea.  No complaint of skin irritation.  Using Udderly smooth cream, aquapor, aveeno, and sitz baths.  Using silvadene and tramadol for pain. Hasn't used the lidocaine 5% cream as much as it burns. RT held this week Monday-Tuesday.    Objective Findings:   BP 132/60 (BP Site: Right arm)   Pulse 94   Temp 98.5 F (36.9 C) (Temporal)   Resp 18   Wt 62.1 kg (136 lb 12.8 oz)   SpO2 98%   BMI 23.48 kg/m   Gen:NAD, sitting comfortably in chair  Anal: grade 3 radiation dermatitis, increased moist desquamation extending from peri-anal area to vulva that is now continuous.    ASSESSMENT:     Tolerating treatment with expected toxicities given dose.    Prescription, setup, and dosimetry are appropriate. All port films and/or verification images have been reviewed by MD prior to next fraction and documented in [place].    Plan:  1.Complete Radiation Monday-Tuesday, then f/u in 2 weeks  2. Continue skin care as instructed. Continue silvadene and tramadol for pain.  3. Continue imodium as needed.    4. Sitz baths more  often in day    Acie Fredrickson, MD MS  Department of Radiation Oncology  [facility]

## 2018-08-19 NOTE — Progress Notes (Signed)
08/19/18 1439   Radiation Therapy Patient Care - Pelvis, Female   Fraction  28   Daily Dose 180   Total Dose 5040   Prescribed Dose 5400   Planned Boost  No   Site anus   Comfort Alteration   Karnofsky Performance Score 100%-normal, no complaints   Fatigue 3   Pain Score 5   Pain Loc RECTUM   Pain Intervention 4-adjuvant medication (e.g. neuroleptics, amitriptyline, carbamazepine)   Effectiveness of Pain Intervention 3-pain relieved 75%   Nutrition Alteration   Anorexia 1-loss of appetite   Nausea 0-none   Vomiting 0-none   Weight 62.1 kg (136 lb 12.8 oz)   Elimination Alteration   Constipation 0 - None   Diarrhea 1 - Increase of < 4 stools/day over baseline pretreatment   Urinary Frequency, Urgency 0-normal   Urinary Incontinence 0-absent   Dysuria 0-none   Skin Alteration   Radiation Dermatitis 2-moderate to brisk erythemia or patchy moist desquamation, mostly confined to skin folds and creases, or moderate edema   Vital signs   Temp 98.5 F (36.9 C)   Temp Source Temporal   Heart Rate 94   Resp Rate 18   BP 132/60   Fall Risk           Moderate      Doing fairly well.  Pain in the rectal area and skin irritated when comes in contact with urine.  Appetite is fair, occasional diarrhea, no constipation.  Suggested a peri bottle to dilute urine and Sitz bath to soothe rectal area.

## 2018-08-23 ENCOUNTER — Encounter: Payer: Self-pay | Admitting: Radiation Oncology

## 2018-08-23 ENCOUNTER — Ambulatory Visit: Payer: Self-pay | Admitting: Radiation Oncology

## 2018-08-23 DIAGNOSIS — C210 Malignant neoplasm of anus, unspecified: Secondary | ICD-10-CM

## 2018-08-23 NOTE — Progress Notes (Signed)
[place] RADIATION ONCOLOGY WEEKLY TREATMENT NOTE    Date Time:  4:58 PM 08/23/2018   Patient Name: Jill Ross, Jill Ross  Diagnosis: Anal cancer with vulvar extension    Current Dose and Fraction: 54000/5400 cGy in 30/30 fx including a SIB.    NURSING ASSESSMENT   08/23/18 1500   Radiation Therapy Patient Care - Pelvis, Female   Fraction  30   Daily Dose 180   Total Dose 5400   Prescribed Dose 5400   Planned Boost  No   Site anus   Comfort Alteration   Karnofsky Performance Score 100%-normal, no complaints   Fatigue 3   Pain Score 5   Pain Loc RECTUM   Pain Intervention 4-adjuvant medication ([name]. neuroleptics, amitriptyline, carbamazepine)   Effectiveness of Pain Intervention 3-pain relieved 75%   Nutrition Alteration   Anorexia 1-loss of appetite   Nausea 0-none   Vomiting 0-none   Elimination Alteration   Constipation 0 - None   Diarrhea 1 - Increase of < 4 stools/day over baseline pretreatment   Urinary Frequency, Urgency 0-normal   Urinary Incontinence 0-absent   Dysuria 0-none   Skin Alteration   Radiation Dermatitis 3   Doing fairly well.  Pain in the rectal area and skin irritated when comes in contact with urine.  Appetite is fair, occasional diarrhea, no constipation.  Suggested a peri bottle to dilute urine and Sitz bath to soothe rectal area.        PHYSICIAN ASSESSMENT    SUBJECTIVE:   Jill Ross is a 67 y.o. female who is seen today for on-treatment clinical management during radiation therapy.     Pertinent subjective/objective findings are documented in the above flowsheet    Some rectal soreness when sitting but has chairs at home that are more comfortable. Mild fatigue.  Diarrhea 2-3X/day, using 1 imodium QOD.  Appetite fair, no weight loss.  Drinking fluids without difficulty.  No nausea.  No complaint of skin irritation.  Using Udderly smooth cream, aquapor, aveeno, and sitz baths.  Using silvadene and tramadol for pain. Hasn't used the lidocaine 5% cream as much as it burns. RT held last week  Monday-Tuesday.    Objective Findings:   There were no vitals taken for this visit.  Gen:NAD, sitting comfortably in chair  Anal: grade 3 radiation dermatitis, increased moist desquamation extending from peri-anal area to vulva that is now continuous.    ASSESSMENT:     Tolerating treatment with expected toxicities given dose.    Prescription, setup, and dosimetry are appropriate. All port films and/or verification images have been reviewed by MD prior to next fraction and documented in [place].    Plan:  1.Completed Radiation, then f/u in 2 weeks  2. Continue skin care as instructed. Continue silvadene and tramadol for pain.  3. Continue imodium as needed.    4. Sitz baths more often in day    Acie Fredrickson, MD MS  Department of Radiation Oncology  [facility]

## 2018-08-23 NOTE — Patient Instructions (Addendum)
Discharge Medication List          Accurate as of August 23, 2018  2:11 PM. If you have any questions, ask your nurse or doctor.            Taking    atorvastatin 10 MG tablet  Dose:  10 mg  Commonly known as:  LIPITOR  Take 10 mg by mouth every evening      Lidocaine (Anorectal) 5 % Crea  Apply small amount to perianal tissue BID     lidocaine-prilocaine cream  Commonly known as:  EMLA  Apply EMLA Cream to Mediport site one hour before treatment.  Cover with plastic wrap.     * silver sulfADIAZINE 1 % cream  Commonly known as:  SILVADENE, SSD  Apply topically 2 (two) times daily     * silver sulfADIAZINE 1 % cream  Commonly known as:  SILVADENE, SSD  Apply topically 3 (three) times daily     traMADol 50 MG tablet  Dose:  50 mg  Commonly known as:  ULTRAM  Take 1 tablet (50 mg total) by mouth every 6 (six) hours as needed for Pain     Vitamin D3 50 MCG (2000 UT) Tabs  Take by mouth every evening         * This list has 2 medication(s) that are the same as other medications prescribed for you. Read the directions carefully, and ask your doctor or other care provider to review them with you.                Discharge planning  end of radiation treatment instructions  You have completed (30) radiation treatments to your Pelvis  beginning on 07/11/18 and ending on 08/23/18. You were treated by Dr. Lawerance Cruel. If informed by your physician, you will need to call to make arrangements with our front desk staff for your follow-up appointment at [phone] or in person.  Please make arrangements to schedule a follow-up appointment with your referring physician as well.  A summary of your radiation treatment will be sent to your referring physician. For other physicians to receive a summary of your radiation treatment please inform our front desk staff.  skin:  Irritation or skin problems continue for several weeks. Be GENTLE with your skin in the treatment area until problems have gone. Dryness may continue and could be  permanent.  1. Continue any treatment we have prescribed until your skin has clearly improved.  2. You may moisturize the treated area with your preferred lotion or cream.  3. If your skin develops wet desquamation (weeping skin) - call for instructions.  4. Minimize sun exposure of the treated area by keeping the treated area covered and by using a SUNSCREEN protector of 15 or higher.  activity: Fatigue, feeling of weakness experienced during treatment usually resolves over 4 - 6 weeks or longer. Resume or continue your usual daily routine activities as tolerated. If you feel tired, limit activities and rest as needed.  diet: Continue eating a balanced, healthy diet. Your body is recovering from treatment and needs good nutrition. If you are losing weight without trying to do so, you need more nutrients. Add 2 or 3 cans of a nutritional supplement of your choice each day or contact Racheal Patches, our nutritionist at [phone].  pain: If you have pain, continue taking your pain medication as needed. Some pain medications cannot be refilled without a new prescription. Pain can continue to diminish for several weeks after  radiation is completed. If you are not sure about your pain medication, contact your prescribing physician. If you experience new onset pain in the treatment area, call your physician at the [facility].       medication Reconciliation: Continue taking your current medications unless instructed otherwise. For refills, it is generally best to call the prescribing physician.    If you should have any concerns, questions, or side effects you wish to discuss, please call the nurses' line at [phone]. Please feel free to call the front desk, also, at [phone].    additional information:  F/U per Dr. Lawerance Cruel

## 2018-08-23 NOTE — Progress Notes (Signed)
08/23/18 1500   Radiation Therapy Patient Care - Pelvis, Female   Fraction  30   Daily Dose 180   Total Dose 5400   Prescribed Dose 5400   Planned Boost  No   Site anus   Comfort Alteration   Karnofsky Performance Score 100%-normal, no complaints   Fatigue 3   Pain Score 5   Pain Loc RECTUM   Pain Intervention 4-adjuvant medication (e.g. neuroleptics, amitriptyline, carbamazepine)   Effectiveness of Pain Intervention 3-pain relieved 75%   Nutrition Alteration   Anorexia 1-loss of appetite   Nausea 0-none   Vomiting 0-none   Elimination Alteration   Constipation 0 - None   Diarrhea 1 - Increase of < 4 stools/day over baseline pretreatment   Urinary Frequency, Urgency 0-normal   Urinary Incontinence 0-absent   Dysuria 0-none   Skin Alteration   Radiation Dermatitis 2-moderate to brisk erythemia or patchy moist desquamation, mostly confined to skin folds and creases, or moderate edema   Final XRT   Discharge instructions given.   Will come in for f/u in 2 weeks.

## 2018-08-27 NOTE — Progress Notes (Signed)
[facility]  - RADIATION ONCOLOGY END OF TREATMENT SUMMARY    Date:               08/23/2018    Pt Name:         Jill Ross (1950-11-28)    MRN:               [id number]    Diagnosis:       1. Anal cancer          ID: [name] is a 67 y.o. 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    Oncology History:  She presented to her primary care physician in 02/2018 with intermittent anal pain with hematochezia, initially attributed to hemorrhoids. This was treated with hemorrhoid cream without improvement in symptoms. Her last colonoscopy was about 5 years prior, when polyps were removed.  She eventually was referred to Dr. Tamera Punt in 05/2018. Digital rectal exam revealed prolapsed anal mass in the left lateral quadrant extending into the canal measuring about 5-6 cm. Biopsy of anal mass revealed moderately differentiated invasive squamous cell carcinoma. She met with Dr. Lady Gary and Dr. Iran Planas on 06/20/18 and definitive chemoradiation was recommended after PET-CT.  She underwent PET-CT on 06/27/18 that revealed a 4 X 7.4 cm anal mass with likely prolapse (SUV 18.4), mild pre-sacral soft tissue thickening and stranding with low level uptake, anterior margin against vagina difficult to define, asymmetric soft tissue density posteriorly along with right vaginal cuff 1.7 X 2.6 cm (SUV 7.6), left perirectal 0.8 cm and left pre-sacral LN 0.7 cm SUV 1.2 for both.  Left inguinal LN 1.5 cm (SUV 2.2). There was a 3.1 cm right adnexal cyst.  MRI pelvis on 07/05/18 revealed extensive enhancing mass involving the lower rectum and anus extending into adjacent fat, right vagianl cuff area with 2.8 X 1.4 cm enahncing mass c/w disease, no iliac/inguinal adenopathy.  She lives in [place] and preferred radiotherapy locally.    She completed a course of external beam radiotherapy in our department.     Treatment   Site   Fields   Energy   Ref Pt/    IDL   Daily  Dose    Fx   Total    Dose    Treatment Dates Elapsed Days   Primary Anal      Primary LNs      Pelvis VMAT     10 MV  PTV54_Primary/100% 180 cGy      168 cGy      150 cGy 30 5400 cGy      5040 cGy      4500 cGy 10/14-11/26/19 44     1 cm bolus over anal/vulva  Intent: definitive  Concurrent Therapy: 5FU/[facility]MC  IGRT: CBCT daily    Radiation Tolerance:  [name] developed expected symptoms from radiotherapy including fatigue, grade 3 radiation dermatitis, increased moist desquamation extending from peri-anal area to vulva that is now continuous, diarrhea 2-3 X/day resolved with imodium, no weight loss, and perianal pain controlled with silvadene and tramadol.  There were unplanned treatment breaks towards the end of RT due to treatment toxicities, namely the skin.      Follow-up:  CAITLAIN TWEED completed her therapy in our clinic in stable condition.  She will return for routine follow-up in 3-4 weeks to check resolution of acute radiation toxicities.  She will  continue to follow closely with medical oncology and colorectal surgery.      Thank you for allowing me to participate in the management and care of this patient. If I may answer any questions in the interim, please do not hesitate to contact me at any time.     Acie Fredrickson, MD MS  Dept of Radiation Oncology  [facility]

## 2018-09-05 ENCOUNTER — Encounter: Payer: Self-pay | Admitting: Hematology & Oncology

## 2018-09-05 ENCOUNTER — Ambulatory Visit
Payer: No Typology Code available for payment source | Attending: Hematology & Oncology | Admitting: Hematology & Oncology

## 2018-09-05 ENCOUNTER — Other Ambulatory Visit (FREE_STANDING_LABORATORY_FACILITY): Payer: No Typology Code available for payment source

## 2018-09-05 VITALS — BP 131/79 | HR 103 | Temp 97.6°F | Wt 132.2 lb

## 2018-09-05 DIAGNOSIS — C210 Malignant neoplasm of anus, unspecified: Secondary | ICD-10-CM

## 2018-09-05 LAB — COMPREHENSIVE METABOLIC PANEL
ALT: 25 U/L (ref 0–55)
AST (SGOT): 28 U/L (ref 5–34)
Albumin/Globulin Ratio: 0.8 — ABNORMAL LOW (ref 0.9–2.2)
Albumin: 3.2 g/dL — ABNORMAL LOW (ref 3.5–5.0)
Alkaline Phosphatase: 128 U/L — ABNORMAL HIGH (ref 37–106)
BUN: 10 mg/dL (ref 7.0–19.0)
Bilirubin, Total: 0.4 mg/dL (ref 0.1–1.2)
CO2: 26 mEq/L (ref 21–29)
Calcium: 9.5 mg/dL (ref 8.5–10.5)
Chloride: 105 mEq/L (ref 100–111)
Creatinine: 0.8 mg/dL (ref 0.4–1.5)
Globulin: 4.2 g/dL — ABNORMAL HIGH (ref 2.0–3.7)
Glucose: 133 mg/dL — ABNORMAL HIGH (ref 70–100)
Potassium: 4.3 mEq/L (ref 3.5–5.1)
Protein, Total: 7.4 g/dL (ref 6.0–8.3)
Sodium: 139 mEq/L (ref 136–145)

## 2018-09-05 LAB — CBC AND DIFFERENTIAL
Absolute NRBC: 0 10*3/uL (ref 0.00–0.00)
Basophils Absolute Automated: 0.06 10*3/uL (ref 0.00–0.08)
Basophils Automated: 1.1 %
Eosinophils Absolute Automated: 0.31 10*3/uL (ref 0.00–0.44)
Eosinophils Automated: 5.9 %
Hematocrit: 32.4 % — ABNORMAL LOW (ref 34.7–43.7)
Hgb: 10.5 g/dL — ABNORMAL LOW (ref 11.4–14.8)
Immature Granulocytes Absolute: 0.03 10*3/uL (ref 0.00–0.07)
Immature Granulocytes: 0.6 %
Lymphocytes Absolute Automated: 0.49 10*3/uL (ref 0.42–3.22)
Lymphocytes Automated: 9.3 %
MCH: 28.1 pg (ref 25.1–33.5)
MCHC: 32.4 g/dL (ref 31.5–35.8)
MCV: 86.6 fL (ref 78.0–96.0)
MPV: 9.1 fL (ref 8.9–12.5)
Monocytes Absolute Automated: 0.51 10*3/uL (ref 0.21–0.85)
Monocytes: 9.6 %
Neutrophils Absolute: 3.89 10*3/uL (ref 1.10–6.33)
Neutrophils: 73.5 %
Nucleated RBC: 0 /100 WBC (ref 0.0–0.0)
Platelets: 352 10*3/uL — ABNORMAL HIGH (ref 142–346)
RBC: 3.74 10*6/uL — ABNORMAL LOW (ref 3.90–5.10)
RDW: 20 % — ABNORMAL HIGH (ref 11–15)
WBC: 5.29 10*3/uL (ref 3.10–9.50)

## 2018-09-05 LAB — GFR: EGFR: >60

## 2018-09-05 NOTE — Progress Notes (Signed)
Subjective:         Anal cancer    06/20/2018 Initial Diagnosis     Anal cancer      07/05/2018 - 07/05/2018 Chemotherapy     fluorouracil (ADRUCIL) 6,920 mg in sodium chloride 0.9 % 388.4 mL chemo infusion CADD cassette, 4,000 mg/m2, Intravenous, Once - Over 96 hours, 0 of 1 cycle  mitoMYcin (MUTAMYCIN) 17.25 mg in sterile water (preservative free) 34.5 mL chemo infusion, 10 mg/m2, Intravenous, Once, 0 of 1 cycle      07/11/2018 -  Chemotherapy     fluorouracil (ADRUCIL) 6,800 mg in sodium chloride 0.9 % 250 mL chemo infusion CADD cassette, 4,000 mg/m2 = 6,800 mg, Intravenous, Once - Over 96 hours, 1 of 1 cycle  Administration: 6,800 mg (07/11/2018), 6,800 mg (08/08/2018)  mitoMYcin (MUTAMYCIN) 17 mg in sterile water (preservative free) 34 mL chemo infusion, 10 mg/m2 = 17 mg, Intravenous, Once, 1 of 1 cycle  Administration: 17 mg (07/11/2018)               Treatment History:  Chemo/rt with 5-FU/Mitomycin. This was completed in November 2019.    Interval History:  She has completed chemo/rt  She has anal burning and pruritis.  She is comfortable, except when she sits down.     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    She presented with one month of anal pain and pressure in August 2019. Initially, she was treated for hemorrhoids without therapeutic effect. She was seen by Dr. Sena Hitch of colorectal surgery and was found to have 5-6 cm mass in the left lateral aspect of the anus.     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 A PET scan showed an intensely hypermetabolic anal mass with likely   prolapse 4.0 x 7.4 cm maximum SUV 18.4. There was mild hypermetabolism in the inferior vagina and right vaginal cuff. There were mildly enlarged inguinal and presacral lymph nodes with only very mild hypermetabolism (mostly with SUV under 3).    She started treatment in early October and completed in November.      PMH:  Anal fissure  High cholesterol    Fam  Hx:  Prostate cancer in 39s  Mother with cancer in 90s.    Social Hx:  Never smoker      Review of Systems   General ROS: negative for  fatigue or weight loss or anorexia  Respiratory ROS: no cough, shortness of breath, or wheezing  Gastrointestinal ROS: negative for - abdominal pain, constipation, diarrhea or nausea/vomiting  Head and Neck ROS: no thrush or mucositis  Cardiovascular ROS: No chest pain or palpitations  Derm ROS: No new rashes  CNS: Negative for new focal weakness or numbness.  Extremities: Negative worsening edema or cyanosis.  GU: Negative for incontinence of Dysuria  Skeleton: Negative for difficulty with ambulation or focal bone pain    The remainder of the patient's 14 point review of symptoms is negative       Objective:     Wt Readings from Last 3 Encounters:   09/05/18 60 kg (132 lb 3.2 oz)   08/19/18 62.1 kg (136 lb 12.8 oz)   08/12/18 63.4 kg (139 lb 11.2 oz)   BP 131/79   Pulse (!) 103   Temp 97.6 F (36.4 C) (Oral)   Wt 60 kg (132 lb 3.2 oz)   BMI 22.69 kg/m   Neck - supple, no significant adenopathy  Lymphatics - no palpable lymphadenopathy, no hepatosplenomegaly  Chest - clear to auscultation, no wheezes, rales or rhonchi, symmetric air entry  Heart - normal rate, regular rhythm, normal S1, S2, no murmurs, rubs, clicks or gallops  Abdomen - soft, nontender, nondistended, no masses or organomegaly  Extremities - peripheral pulses normal, no pedal edema, no clubbing or cyanosis, no pedal edema noted,no clubbing or erythema  Skin - normal coloration and turgor, no rashes, no suspicious skin lesions noted   rectal exam: hemorrhoids; no residual mass appreciated. There was significant inflammation/proctitis seen extending anteriorly on the perineum.    Rads:     No results found.         Assessment:       67 year old with at least T3 squamous cell carcinoma of the anus      Plan:       At least T3 anal SCC with likely involvement of inferior vagina and right vaginal cuff. There were  also mildly enlarged inguinal and presacral lymph nodes.    -She has completed chemo/rt.  -PET scan in February  -Will see her after PET scan.  -CBC, CMP today.    Radiaton Proctitis.  Trial of proctofoam in addition to anusol.  It is improving.    Goals of care:  Curative

## 2018-09-10 ENCOUNTER — Encounter: Payer: Self-pay | Admitting: Hematology & Oncology

## 2018-09-16 ENCOUNTER — Ambulatory Visit
Admission: RE | Admit: 2018-09-16 | Discharge: 2018-09-16 | Disposition: A | Discharge status: Home / Self Care | Payer: No Typology Code available for payment source | Source: Ambulatory Visit | Attending: Radiation Oncology | Admitting: Radiation Oncology

## 2018-09-16 ENCOUNTER — Encounter: Payer: Self-pay | Admitting: Radiation Oncology

## 2018-09-16 ENCOUNTER — Ambulatory Visit: Payer: Self-pay | Admitting: Radiation Oncology

## 2018-09-16 DIAGNOSIS — Z923 Personal history of irradiation: Secondary | ICD-10-CM | POA: Insufficient documentation

## 2018-09-16 DIAGNOSIS — C210 Malignant neoplasm of anus, unspecified: Secondary | ICD-10-CM | POA: Insufficient documentation

## 2018-09-16 DIAGNOSIS — C778 Secondary and unspecified malignant neoplasm of lymph nodes of multiple regions: Secondary | ICD-10-CM | POA: Insufficient documentation

## 2018-09-16 DIAGNOSIS — Z9221 Personal history of antineoplastic chemotherapy: Secondary | ICD-10-CM | POA: Insufficient documentation

## 2018-09-16 MED ORDER — TRAMADOL HCL 50 MG PO TABS
50.0000 mg | ORAL_TABLET | Freq: Four times a day (QID) | ORAL | 0 refills | Status: DC | PRN
Start: 2018-09-16 — End: 2019-03-07

## 2018-09-16 NOTE — Progress Notes (Signed)
RADIATION ONCOLOGY FOLLOW-UP NOTE    Date of Service: 09/16/2018    Referring Physician: Dr. Lady Gary    HISTORY OF PRESENT ILLNESS:   CC: Anal cancer (C21.0)    Jill Ross was seen today for a routine follow-up visit.  As you recall, she is a 66 y.o. 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    Oncologic History:   She presented to her primary care physicianin 6/2019with intermittent anal pain with hematochezia, initially attributed to hemorrhoids. This was treated with hemorrhoid cream without improvement in symptoms. Her last colonoscopy was about 5 years prior, when polypswereremoved.She eventually was referred to Dr. Tamera Punt in 05/2018. Digital rectal exam revealed prolapsed anal mass in the left lateral quadrant extending into the canal measuring about 5-6 cm. Biopsy of anal mass revealed moderately differentiated invasive squamous cell carcinoma.She met with Dr. Lady Gary and Dr. Iran Planas on 06/20/18 and definitive chemoradiation was recommended after PET-CT. She underwent PET-CT on 06/27/18 that revealed a 4 X 7.4 cm anal mass with likely prolapse (SUV 18.4), mild pre-sacral soft tissue thickening and stranding with low level uptake, anterior margin against vagina difficult to define, asymmetric soft tissue density posteriorly along with right vaginal cuff 1.7 X 2.6 cm (SUV 7.6), left perirectal 0.8 cm and left pre-sacral LN 0.7 cm SUV 1.2 for both.  Left inguinal LN 1.5 cm (SUV 2.2). There was a 3.1 cm right adnexal cyst. MRI pelvis on 07/05/18 revealed extensive enhancing mass involving the lower rectum and anus extending into adjacent fat, right vagianl cuff area with 2.8 X 1.4 cm enahncing mass c/w disease, no iliac/inguinal adenopathy.  She lives in [place] and preferred radiotherapy locally.  She received 45 Gy in 30 fractions to the pelvis with a simultaneous integrated boost  to the primary involved lymph nodes less than 3 cm in size to 50.4 Gy in 30 fractions as well as the primary anal tumor to 54 Gy in 30 fractions.  Her entire treatment course was completed on August 23, 2018.  She did receive concurrent 5-FU and mitomycin-C with Dr. Lady Gary.    She presents today feeling well. She notes mild residual fatigue, peri-anal tissue/vaginal tissue throbbing and tingling.  She is still using the silvadene and the vulva and anal area.  She is having one episode of diarrhea QOD but has not needed imodium for over a week.  She denies fecal incontinence or rectal bleeding.  She notes urinary frequency, nocturia.  She denies dysuria, hematuria.  She cannot sit for a long period of time.  She notes pelvic muscle is tighter.  She is currently not sexually active.  She denies SOB, cough, wheezing, chest pain, palpitations, abdominal pain, N/V, headaches, vision changes, seizures, numbness/tingling, focal weakness, ataxia, falls, or bony pain. No other complaints.    I have reviewed Jill Ross's medical, surgical and other pertinent history in detail, and have updated medication and allergy information in the electronic medical record.    ROS: Pertinent ROS as noted above. Otherwise negative or non-contributory.     PHYSICAL EXAM:   BP 102/69 (BP Site: Right arm, Patient Position: Sitting, Cuff Size: Small)   Pulse 88   Temp 97.2 F (36.2 C) (Oral)   Resp 18   Wt 59.9 kg (132 lb)   BMI 22.66 kg/m   ECOG: 0  PAIN SCORE: 0  PAIN INTERVENTION: None  CONSTITUTIONAL: This is a  well developed, well-nourished female in NAD, appears stated age.   HEENT: NC/AT, PERRL, EOMI, sclera anicteric. MMM, no oral lesions.   NECK: Supple, with no thyromegaly, and non-tender. No tracheal deviation. No cervical or SCL LAD  CARDIAC: Regular rate and rhythm. Normal S1, S2. No murmurs, rubs or gallops.   PULMONARY: Lungs are CTAB without w/r/r.    ABDOMINAL: soft, NT, ND. No HSM. Normoactive bs throughout. No  guarding, rebound.   RECTAL/PELVIS: Perianal area with minimal residual moist desquamation immediately near her hemorrhoids, multiple external hemorrhoids, internal anal tumor anteriorly palpable still but significantly reduced in size and volume.  There is no obstructing lesion in the anal region.  Her rectal tone is decreased but still present.  She has dry desquamation in the perianal tissue as well as in the vulvar area.  She has fibrotic ideations between the labia majora and labia minora as well as between the bilateral labia minora.  These adhesions were gently broken and there was mild associated bleeding related to this.  On vaginal exam only the distal vagina was evaluated and there was posterior vaginal changes that have been noted previously that were significantly reduced on exam today.  BACK: Straight & aligned, no CVA tenderness. Axial skeleton non-tender to percussion.   EXTREMITIES: Full ROM in all extremities, particularly b/l LE. No c/c/e. No lymphedema        LABS:   Lab Results   Component Value Date    WBC 5.29 09/05/2018    HGB 10.5 (L) 09/05/2018    HCT 32.4 (L) 09/05/2018    MCV 86.6 09/05/2018    PLT 352 (H) 09/05/2018     Lab Results   Component Value Date    NA 139 09/05/2018    K 4.3 09/05/2018    CL 105 09/05/2018    CO2 26 09/05/2018       RADIOLOGY: none since RT completion      ASSESSMENT AND PLAN:   67 y.o. 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    She is recovering well from the acute side effects of radiotherapy.  She does still have mild perianal discomfort, diarrhea not requiring Imodium at this time, and vulvar/vaginal adhesions.  I did refill a prescription for tramadol given her vaginal discomfort at times.  She is aware that when showering or bathing she should gently try to move the vulvar and vaginal tissue to try to break up any adhesions that are developing.  She  has no evidence of progressive disease.  I provided her with a medium sized vaginal dilator today with instructions of use. She will not start this for another 3 weeks.  I have also given her a referral for pelvic floor rehabilitation.  She will continue to follow with medical oncology and CRS.  She will have a repeat PET-CT in 10/2018 and f/u with Dr. Lady Gary at that time.  I will see her back in 4 months for a routine follow-up with me.  She was provided contact information should she have any questions in the interim.    Thank you for allowing me to participate in the care of this patient.     25 minutes of 30 minutes were spent in direct patient counseling and coordination of care.     Acie Fredrickson, MD MS  [facility]  [facility]

## 2018-09-16 NOTE — Progress Notes (Signed)
09/16/18 0929   Radiation Therapy Patient Care - Pelvis, Female   Site Pelvis  (anus)   Comfort Alteration   Karnofsky Performance Score 90%-can perform normal activity, minor signs of disease   Fatigue 3   Pain Score 2   Pain Loc RECTUM   Pain Intervention 3-opioids   Effectiveness of Pain Intervention 4-pain relieved 100%   Nutrition Alteration   Anorexia 1-loss of appetite   Nausea 0-none   Vomiting 0-none   Weight 59.9 kg (132 lb)   Elimination Alteration   Constipation 0 - None   Diarrhea 0-none   Urinary Frequency, Urgency 1-increase in frequency or  nocturia up to 2 x normal   Urinary Incontinence 0-absent   Dysuria 0-none   Skin Alteration   Skin Care Silvadene;Other   Emotional Alteration   Coping 0-effective   Vital signs   Temp 97.2 F (36.2 C)   Temp Source Oral   Heart Rate 88   Resp Rate 18   BP 102/69     Signed: Rickard Rhymes, RN, BSN

## 2018-09-26 ENCOUNTER — Ambulatory Visit
Admission: RE | Admit: 2018-09-26 | Discharge: 2018-09-26 | Disposition: A | Discharge status: Home / Self Care | Payer: No Typology Code available for payment source | Source: Ambulatory Visit | Attending: Radiation Oncology | Admitting: Radiation Oncology

## 2018-09-26 DIAGNOSIS — Z9221 Personal history of antineoplastic chemotherapy: Secondary | ICD-10-CM | POA: Insufficient documentation

## 2018-09-26 DIAGNOSIS — R102 Pelvic and perineal pain: Secondary | ICD-10-CM | POA: Insufficient documentation

## 2018-09-26 DIAGNOSIS — N393 Stress incontinence (female) (male): Secondary | ICD-10-CM | POA: Insufficient documentation

## 2018-09-26 DIAGNOSIS — C210 Malignant neoplasm of anus, unspecified: Secondary | ICD-10-CM | POA: Insufficient documentation

## 2018-09-26 DIAGNOSIS — Z923 Personal history of irradiation: Secondary | ICD-10-CM | POA: Insufficient documentation
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
## 2018-10-04 ENCOUNTER — Ambulatory Visit
Admission: RE | Admit: 2018-10-04 | Discharge: 2018-10-04 | Disposition: A | Discharge status: Home / Self Care | Payer: No Typology Code available for payment source | Source: Ambulatory Visit | Attending: Radiation Oncology | Admitting: Radiation Oncology

## 2018-10-04 DIAGNOSIS — M6289 Other specified disorders of muscle: Secondary | ICD-10-CM | POA: Insufficient documentation

## 2018-10-04 DIAGNOSIS — C210 Malignant neoplasm of anus, unspecified: Secondary | ICD-10-CM | POA: Insufficient documentation

## 2018-10-04 DIAGNOSIS — Z923 Personal history of irradiation: Secondary | ICD-10-CM | POA: Insufficient documentation

## 2018-10-04 DIAGNOSIS — Z9221 Personal history of antineoplastic chemotherapy: Secondary | ICD-10-CM | POA: Insufficient documentation

## 2018-10-04 DIAGNOSIS — R102 Pelvic and perineal pain: Secondary | ICD-10-CM | POA: Insufficient documentation

## 2018-10-04 NOTE — PT Plan of Care Note (Signed)
[facility]  Physical Therapy Pelvic Floor Treatment Note    Patient:   Jill Ross                                                        MRN#: [id number]  Date: PT Received On: 10/04/18    PT Visit Number: 2    Primary Diagnosis:      N 39.3 Stress Incontinence  R 10.2 Pelvic pain in female                             Ref. MD: Dr. Acie Fredrickson, MD  Rx Order:  Post-anal cancer chemoradiation therapy    PT POC signed by 4th visit:   yes  PT Current Certification Period:   Insurance: Medicare    Time of treatment:   Time of treatment: Time Calculation  PT Received On: 10/04/18  Start Time: 1445  Stop Time: 1558  Time Calculation (min): 73 min           Pt history: Pt diagnosed Sept 2019. Went through chemo and radiation; finished radiation 3rd week in November. Pt reports tightness, burning, itching in anus and vagina. Sitting: must have a cushion under her buttocks.  4/10 at worst (tightness).  Tightness in anus & vagina when bending down, also when putting legs in bathtub, she can feel tightness in pelvic area.  Also feels it with sit to stand.  Also seems like her tailbone protrudes more than it used to and it causes pain. Feels tailbone pain mostly when sitting. She's using silver cream rx from [place]; she doesn't always apply it.  Hysterectomy 1990. She will be traveling 3rd week of January to [place]; unsure how she will feel with the plane ride (3 hours). She has limited social activities since diagnosis and while recovering due to low energy.         Subjective: Pt reports she cancelled trip to [place]- feels that the sitting for travel will be too much for her at this time.     Pain Level: rectal/coccyx pain w/ sitting      Objective/Measurements:    Treatment performed with chaperone present per patient preference: yes  Pt. would like to pursue pelvic floor treatment plan discussed at this time. Patient agrees to internal exam/ treatment.  Internal exam consent on file. scanned in EPIC  Media tab      Therapeutic Exercise:    Intervention necessary for  Flexibility/Rom to maximize lessen pain.    Therapeutic Exercise 1/7     Pelvic pain stretches -SKC  -SKC w/ ER  -PPT  -cat/cow  -child pose  -standing leg add  - standing hip flexor stretch                                       Manual Therapy:   Sacral/Ilium/ischial springing in supine, prone child pose via Hesch method with passive or active treatment based on findings.    In right sidelying: Soft Tissue Massage coccyx and buttocks (incl lateral finger walking and rolling technique). Myofascial Release. Trigger Point Release to buttocks.     Gentle coccyx mobilizations in sidelying. Coccyx is flexed and  deviated to the left. Attempted "stuck drawer" coccyx mobilization in hooklying, will try in sitting next session.      Intervention necessary to treat restricted motion of soft tissues , lessen pain, reduce trigger points and iprove soft tissue and/or fascia mobility in pelvis and surrounding areas.        Functional Activities/ Therapeutic Activities:  - Instructed pt on improved PFM contraction using cues "squeeze, lift, up towards belly button." Cueing to not use abdominals ribcage, adductors for compensation. Improved after cueing and pt was able to isolate PFM contraction.     -Educated pt on PF exercise apps: Squeezy app, Teaching laboratory technician, KPFE app  - Standing posture: cueing for pelvis inferior to ribcage, weight equal throughout feet, chest slightly lifted, shoulders back slightly.  - instructed pt on the knack    -Trial of towel roll under thighs in sitting to off-load coccyx and provide mild sacral distraction.      Education on Genital Hygiene and General Skin care.    Education on types of lubricants to assist with skin integrity and skin care.       Intervention necessary to improve functional activities (sitting) and self care to improve functional performance in a progressive manner.   They require the professional skills of providers  and are designed to address specific functional need of patient.        Patient Education: Patient provided with: written copy of HEP. Educated on home exercise program, development and progression, next visit appointment and posture.  Patient verbalized understanding.       HEP    kegels As instructed    Pelvic pain stretches As instructed              Other: Patient denied travel out of the country or contact with someone who had recently traveled out of the country.    Patient required direct (one-on-one) patient contact. Skilled physical therapy intervention and the application of clinical skills and/or services medically necessary to improve function.      Assessment: Patient with tightness around coccyx with deviation to the left. Pt with anal pain and vaginal pain s/p chemo-radiation that presents as burning, itching, and tightness, does not like to sit, prefers standing.   Upon exam pt presents with significant joint stiffness and restriction throughout spine and pelvis. Noted muscle weakness throughout pelvic floor and compensation from LE, breath holding, and abdominals.  Exam findings consistent with diagnosis.  Pt seems well motivated and is a good candidate for PT intervention with emphasis on pelvic floor rehabilitation to help her/him improve bladder control.     Progress towards goals:     Short Term Goals: (in 4 weeks)    1. Independent with performance of a HEP of PFM on a daily basis.  2. Pt will be able to isolate PFM during contraction and not compensate with other muscle groups in order to improve strength and awareness of PFM  3. Pt will demonstrate proper bearing down in order to decrease risk for hemorrhoids  4. Pt will be able to sit with coccyx pain less than 2/10 in order to improve activity tolerance  5. Pt will demonstrate proper standing posture in order to decrease stresses on pelvis and pelvic floor    Long Term Goals:  (in 8 Weeks)  1. Pt will demonstrate use of functional PFM  contraction by performing a pre-contraction ("knack") to eliminate UI during a cough/sneeze  2. Pt will demonstrate proper body mechanics with bending down  in order to decrease strain on pelvic floor and spine  3. Patient with demonstrate an increase in PFM endurance to at least 10 seconds in order to improve strength  4. Pt will experience overall pain less than 2/10 with daily activities    Plan:PT once/week as schedules allow for 8 weeks  Adjustments to POC:       Charges Minutes/Units   PT re-eval     Ther ex $ PT Therapeutic Exercise (97110): 1 Unit   Ther act     Neuro Re-Ed     Manual $ PT Manual Therapy (16109): 2 Unit   Self Care $ PT Self Care Training ea (60454): 1 unit   E-stim         Eber Hong, PT  Physical Therapist  [facility]  [place] License # [id number]  [email or web address]  [phone]  2:10 PM  10/07/2018

## 2018-10-11 ENCOUNTER — Ambulatory Visit
Admission: RE | Admit: 2018-10-11 | Discharge: 2018-10-11 | Disposition: A | Discharge status: Home / Self Care | Payer: No Typology Code available for payment source | Source: Ambulatory Visit

## 2018-10-11 NOTE — PT Plan of Care Note (Signed)
[facility]  Physical Therapy Pelvic Floor Treatment Note    Patient:   Jill Ross                                                        MRN#: [id number]  Date: PT Received On: 10/11/18    PT Visit Number: 3    Primary Diagnosis:      N 39.3 Stress Incontinence  R 10.2 Pelvic pain in female                             Ref. MD: Dr. Acie Fredrickson, MD  Rx Order:  Post-anal cancer chemoradiation therapy    PT POC signed by 4th visit:   yes  PT Current Certification Period:   Insurance: Medicare    Time of treatment:   Time of treatment: Time Calculation  PT Received On: 10/11/18  Start Time: 1445  Stop Time: 1545  Time Calculation (min): 60 min           Pt history: Pt diagnosed Sept 2019. Went through chemo and radiation; finished radiation 3rd week in November. Pt reports tightness, burning, itching in anus and vagina. Sitting: must have a cushion under her buttocks.  4/10 at worst (tightness).  Tightness in anus & vagina when bending down, also when putting legs in bathtub, she can feel tightness in pelvic area.  Also feels it with sit to stand.  Also seems like her tailbone protrudes more than it used to and it causes pain. Feels tailbone pain mostly when sitting. She's using silver cream rx from [place]; she doesn't always apply it.  Hysterectomy 1990. She will be traveling 3rd week of January to [place]; unsure how she will feel with the plane ride (3 hours). She has limited social activities since diagnosis and while recovering due to low energy.         Subjective: "I'm not really sure if I'm doing the kegels right"   Child pose hurts her knees    Pain Level: rectal/coccyx pain w/ sitting      Objective/Measurements:    Treatment performed with chaperone present per patient preference: yes  Pt. would like to pursue pelvic floor treatment plan discussed at this time. Patient agrees to internal exam/ treatment.  Internal exam consent on file. scanned in EPIC Media tab      Therapeutic  Exercise:    Intervention necessary for  Flexibility/Rom to maximize lessen pain.    Therapeutic Exercise 1/7 10/11/18    Pelvic pain stretches -SKC  -SKC w/ ER  -PPT  -cat/cow  -child pose  -standing leg add  - standing hip flexor stretch -SKC  -SKC w/ ER  -PPT  -cat/cow  -squat holding onto counter  -standing leg add  - standing hip flexor stretch                                      Manual Therapy:   Sacral/Ilium/ischial springing in supine, prone child pose via Hesch method with passive or active treatment based on findings.    Based on findings of sacral springing:  -S/L with pillow under left then right ilium x 2 min  -  standing with left then right foot on stool in ER and fwd trunk flexion x 1 min  -manual superior distraction to left ischial tuberosity in prone x 2 min  -supine with towel roll vertically  under Left ilium x 2 min  -prone with towel roll under left/right pelvis x 2 min    -sit > stands w/ rectus/oblique engagement     Intervention necessary to treat restricted motion of soft tissues , lessen pain, reduce trigger points and iprove soft tissue and/or fascia mobility in pelvis and surrounding areas.        Functional Activities/ Therapeutic Activities:    Neuromuscular reeducation using computerize software with MR-20 attached 2 channels: A channel: (disposable electrodes) PFM 2 red at Levator Ani and 1 green "anchor" at L hip; AND B channel: Transverse abdominis R side. Patient setting: Continuous.      Standing: Quick flicks: The patient was asked to contract the pelvic floor muscle for 2 seconds and relax for 4 seconds for a total of 10 repetitions. A total of 10 repetitions were performed. The working tone of the pelvic floor muscle was an average of 12.3 ?V, a maximum of 24.6 ?V and a minimum of 5.5 ?V.  Standing: Kegels:  patient was asked to contract the pelvic floor muscle for 5 seconds and relax for 5 seconds for a total of 10 repetitions. In all continuous sessions the patient was asked  to contract the pelvic floor muscle and then to relax the pelvic  floor muscle. Session 1: 5W/5R A total of 10 repetitions were performed. The working tone of the pelvic floor muscle was an average of 13.0 ?V, a maximum of 23.9 ?V and  a minimum of 4.4 ?V. Session 2: 5W/10R with goals above line for work, below line for rest/relax: A total of 8 repetitions were performed. The working tone of the pelvic floor muscle was an average of 11.3 ?V, a maximum of  19.5 ?V and a minimum of 7.4 ?V. Session 3: Flower animation: The resting tone of the pelvic floor muscle was an average of 6.2 ?V and a  maximum of 14.9 ?V.        -Educated pt on PF exercise apps: KPFE app: recommend to increase relaxation time 2/2 delayed relaxation.   - Standing posture: cueing for pelvis inferior to ribcage, weight equal throughout feet, chest slightly lifted, shoulders back slightly.     Education on Genital Hygiene and General Skin care.    Education on types of lubricants to assist with skin integrity and skin care.     Intervention necessary to improve functional activities (sitting) and self care to improve functional performance in a progressive manner.   They require the professional skills of providers and are designed to address specific functional need of patient.      Patient Education: Patient provided with: written copy of HEP. Educated on home exercise program, development and progression, next visit appointment and posture.  Patient verbalized understanding.       HEP    kegels As instructed    Pelvic pain stretches As instructed              Other: Patient denied travel out of the country or contact with someone who had recently traveled out of the country.    Patient required direct (one-on-one) patient contact. Skilled physical therapy intervention and the application of clinical skills and/or services medically necessary to improve function.      Assessment: Pt endorses more  relaxed pelvis/coccyx tightness after sacral  springing. Performed biofeedback in standing (pt preference) with delayed relaxation noted.  Pt seems well motivated and is a good candidate for PT intervention with emphasis on pelvic floor rehabilitation to help her/him improve bladder control.     Progress towards goals:     Short Term Goals: (in 4 weeks)    1. Independent with performance of a HEP of PFM on a daily basis. Progressing   2. Pt will be able to isolate PFM during contraction and not compensate with other muscle groups in order to improve strength and awareness of PFM  3. Pt will demonstrate proper bearing down in order to decrease risk for hemorrhoids  4. Pt will be able to sit with coccyx pain less than 2/10 in order to improve activity tolerance  5. Pt will demonstrate proper standing posture in order to decrease stresses on pelvis and pelvic floor    Long Term Goals:  (in 8 Weeks)  1. Pt will demonstrate use of functional PFM contraction by performing a pre-contraction ("knack") to eliminate UI during a cough/sneeze  2. Pt will demonstrate proper body mechanics with bending down in order to decrease strain on pelvic floor and spine  3. Patient with demonstrate an increase in PFM endurance to at least 10 seconds in order to improve strength  4. Pt will experience overall pain less than 2/10 with daily activities    Plan:PT once/week as schedules allow for 8 weeks  Adjustments to POC:       Charges Minutes/Units   PT re-eval     Ther ex $ PT Therapeutic Exercise (97110): 2 Units   Ther act $ PT Therapeutic Activity (97530): 1 unit   Neuro Re-Ed     Manual $ PT Manual Therapy (16109): 1 Unit   Self Care     E-stim         Eber Hong, PT  Physical Therapist  [facility]  [place] License # [id number]  [email or web address]  [phone]  4:58 PM  10/11/2018

## 2018-10-19 ENCOUNTER — Ambulatory Visit
Admission: RE | Admit: 2018-10-19 | Discharge: 2018-10-19 | Disposition: A | Discharge status: Home / Self Care | Payer: No Typology Code available for payment source | Source: Ambulatory Visit

## 2018-10-19 NOTE — PT Plan of Care Note (Signed)
[facility]  Physical Therapy Pelvic Floor Treatment Note    Patient:   Jill Ross                                                        MRN#: [id number]  Date: PT Received On: 10/19/18    PT Visit Number: 4    Primary Diagnosis:      N 39.3 Stress Incontinence  R 10.2 Pelvic pain in female                             Ref. MD: Dr. Acie Fredrickson, MD  Rx Order:  Post-anal cancer chemoradiation therapy    PT POC signed by 4th visit:   yes  PT Current Certification Period:   Insurance: Medicare    Time of treatment:   Time of treatment: Time Calculation  PT Received On: 10/19/18  Start Time: 1402  Stop Time: 1459  Time Calculation (min): 57 min           Pt history: Pt diagnosed Sept 2019. Went through chemo and radiation; finished radiation 3rd week in November. Pt reports tightness, burning, itching in anus and vagina. Sitting: must have a cushion under her buttocks.  4/10 at worst (tightness).  Tightness in anus & vagina when bending down, also when putting legs in bathtub, she can feel tightness in pelvic area.  Also feels it with sit to stand.  Also seems like her tailbone protrudes more than it used to and it causes pain. Feels tailbone pain mostly when sitting. She's using silver cream rx from [place]; she doesn't always apply it.  Hysterectomy 1990. She will be traveling 3rd week of January to [place]; unsure how she will feel with the plane ride (3 hours). She has limited social activities since diagnosis and while recovering due to low energy.         Subjective: Pt reporting skin integrity is better but continues to have external labial swelling that interferes with sitting and walking especially.       Pain Level: rectal/coccyx pain, labial swelling       Objective/Measurements:    Observation/Posture: pt with guarded, slowed ambulation with protective posture (forward shoulders, trunk flexion). Pt also with thoracic kyphosis.     Treatment performed with chaperone present per patient  preference: yes  Pt. would like to pursue pelvic floor treatment plan discussed at this time. Patient agrees to internal exam/ treatment.  Internal exam consent on file. scanned in EPIC Media tab      Therapeutic Exercise:    Intervention necessary for  Flexibility/Rom to maximize lessen pain.    Therapeutic Exercise 1/7 10/11/18 10/19/18   Pelvic pain stretches -SKC  -SKC w/ ER  -PPT  -cat/cow  -child pose  -standing leg add  - standing hip flexor stretch -SKC  -SKC w/ ER  -PPT  -cat/cow  -squat holding onto counter  -standing leg add  - standing hip flexor stretch -skc  -skc w/ ER  - PPT  -squat holding onto counter  -standing leg add  - standing hip flexor stretch     Pelvic stabilization exercises   -Bridging    Postural exercise   -supine thoracic stretch with ball between shoulder blades  - supine mountain  pose to open chest with deep breathing.   --Lumbar stretch from raised table, added propped on elbows for thoracic stretch                          Manual Therapy:     In right sidelying: Soft Tissue Massage coccyx and buttocks (incl lateral finger walking and rolling technique). Myofascial Release. Trigger Point Release to buttocks.     Gentle coccyx mobilizations in sidelying. Coccyx is less flexed w/ decreased deviation to the left compared to 1/7 visit     Use of aloe glide for cooling properties     Intervention necessary to treat restricted motion of soft tissues , lessen pain, reduce trigger points and iprove soft tissue and/or fascia mobility in pelvis and surrounding areas.        Functional Activities/ Therapeutic Activities:    -discussed use of coolpack to aid in managing labial swelling.   -strongly recommended pt follow up with doctor.     -Educated pt on PF exercise apps: KPFE app: recommend to increase relaxation time 2/2 delayed relaxation.  Pt endorses performing kegel app daily.     - Standing posture: cueing for pelvis inferior to ribcage, weight equal throughout feet, chest slightly  lifted, shoulders back slightly.     Education on Genital Hygiene and General Skin care.    Education on types of lubricants to assist with skin integrity and skin care.     Intervention necessary to improve functional activities (sitting) and self care to improve functional performance in a progressive manner.   They require the professional skills of providers and are designed to address specific functional need of patient.      Patient Education: Patient provided with: written copy of HEP. Educated on home exercise program, development and progression, next visit appointment and posture.  Patient verbalized understanding.       HEP    kegels As instructed    Pelvic pain stretches As instructed              Other: Patient denied travel out of the country or contact with someone who had recently traveled out of the country.    Patient required direct (one-on-one) patient contact. Skilled physical therapy intervention and the application of clinical skills and/or services medically necessary to improve function.      Assessment: Pt endorses more relaxed pelvis/coccyx tightness compared to last session.   Pt seems well motivated and is a good candidate for PT intervention with emphasis on pelvic floor rehabilitation to help her/him improve bladder control.     Progress towards goals:     Short Term Goals: (in 4 weeks)    1. Independent with performance of a HEP of PFM on a daily basis. Goal met   2. Pt will be able to isolate PFM during contraction and not compensate with other muscle groups in order to improve strength and awareness of PFM  3. Pt will demonstrate proper bearing down in order to decrease risk for hemorrhoids Goal met   4. Pt will be able to sit with coccyx pain less than 2/10 in order to improve activity tolerance  5. Pt will demonstrate proper standing posture in order to decrease stresses on pelvis and pelvic floor    Long Term Goals:  (in 8 Weeks)  1. Pt will demonstrate use of functional PFM  contraction by performing a pre-contraction ("knack") to eliminate UI during a cough/sneeze  2. Pt  will demonstrate proper body mechanics with bending down in order to decrease strain on pelvic floor and spine  3. Patient with demonstrate an increase in PFM endurance to at least 10 seconds in order to improve strength  4. Pt will experience overall pain less than 2/10 with daily activities    Plan:PT once/week as schedules allow for 8 weeks  Adjustments to POC:       Charges Minutes/Units   PT re-eval     Ther ex $ PT Therapeutic Exercise (97110): 2 Units   Ther act     Neuro Re-Ed     Manual $ PT Manual Therapy (16109): 2 Unit   Self Care     E-stim         Eber Hong, PT  Physical Therapist  [facility]  [place] License # [id number]  [email or web address]  [phone]  5:34 PM  10/19/2018

## 2018-10-26 ENCOUNTER — Ambulatory Visit
Admission: RE | Admit: 2018-10-26 | Discharge: 2018-10-26 | Disposition: A | Discharge status: Home / Self Care | Payer: No Typology Code available for payment source | Source: Ambulatory Visit

## 2018-10-26 NOTE — PT Plan of Care Note (Signed)
[facility]  Physical Therapy Pelvic Floor Treatment Note    Patient:   Jill Ross                                                        MRN#: [id number]  Date: PT Received On: 10/26/18    PT Visit Number: 5    Primary Diagnosis:      N 39.3 Stress Incontinence  R 10.2 Pelvic pain in female                             Ref. MD: Dr. Acie Fredrickson, MD  Rx Order:  Post-anal cancer chemoradiation therapy    PT POC signed by 4th visit:   yes  PT Current Certification Period:   Insurance: Medicare    Time of treatment:   Time of treatment: Time Calculation  PT Received On: 10/26/18  Start Time: 1400  Stop Time: 1458  Time Calculation (min): 58 min           Pt history: Pt diagnosed Sept 2019. Went through chemo and radiation; finished radiation 3rd week in November. Pt reports tightness, burning, itching in anus and vagina. Sitting: must have a cushion under her buttocks.  4/10 at worst (tightness).  Tightness in anus & vagina when bending down, also when putting legs in bathtub, she can feel tightness in pelvic area.  Also feels it with sit to stand.  Also seems like her tailbone protrudes more than it used to and it causes pain. Feels tailbone pain mostly when sitting. She's using silver cream rx from [place]; she doesn't always apply it.  Hysterectomy 1990. She will be traveling 3rd week of January to [place]; unsure how she will feel with the plane ride (3 hours). She has limited social activities since diagnosis and while recovering due to low energy.           Subjective: Swelling is much better, changed type of underwear (seamless) with improved comfort. Planning to move to [place] in spring to be near her 2 sons. Stretches are really helping.  Wants to work on posture.     Pain Level: does not rate today.       Objective/Measurements:    Observation/Posture: pt with protective posture (forward shoulders, trunk flexion). Pt also with thoracic kyphosis.     Treatment performed with chaperone present  per patient preference: yes  Pt. would like to pursue pelvic floor treatment plan discussed at this time. Patient agrees to internal exam/ treatment.  Internal exam consent on file. scanned in [employer] tab      Therapeutic Exercise:    Intervention necessary for  Flexibility/Rom to maximize lessen pain.    Therapeutic Exercise 1/7 10/11/18 10/19/18 10/26/2018   Pelvic pain stretches -SKC  -SKC w/ ER  -PPT  -cat/cow  -child pose  -standing leg add  - standing hip flexor stretch -SKC  -SKC w/ ER  -PPT  -cat/cow  -squat holding onto counter  -standing leg add  - standing hip flexor stretch -skc  -skc w/ ER  - PPT  -squat holding onto counter  -standing leg add  - standing hip flexor stretch   -skc  -skc w/ ER  - PPT  -standing leg add  - standing  hip flexor stretch   Pelvic stabilization exercises   -Bridging  -Bridging    Postural exercise   -supine thoracic stretch with ball between shoulder blades  - supine mountain pose to open chest with deep breathing.   --Lumbar stretch from raised table, added propped on elbows for thoracic stretch  -supine thoracic stretch with ball between shoulder blades  - supine mountain pose to open chest with deep breathing.   --Lumbar stretch from raised table, added propped on elbows for thoracic stretch                             Manual Therapy:   Sacral/Ilium/ischial springing in supine, prone  via Hesch method with passive or active treatment based on findings.    Intervention necessary to treat restricted motion of soft tissues , lessen pain, reduce trigger points and iprove soft tissue and/or fascia mobility in pelvis and surrounding areas.        Functional Activities/ Therapeutic Activities:    - Standing posture: cueing for pelvis inferior to ribcage, weight equal throughout feet, chest slightly lifted, shoulders back slightly.  Discussed postural awareness checks throughout the day and ergonomics.   Plan lifting techniques next visit .      Education on Genital Hygiene and  General Skin care.    Education on types of lubricants to assist with skin integrity and skin care.     Intervention necessary to improve functional activities (sitting) and self care to improve functional performance in a progressive manner.   They require the professional skills of providers and are designed to address specific functional need of patient.      Patient Education: Patient provided with: written copy of HEP. Educated on home exercise program, development and progression, next visit appointment and posture.  Patient verbalized understanding.       HEP    kegels As instructed    Pelvic pain stretches As instructed              Other: Patient denied travel out of the country or contact with someone who had recently traveled out of the country.    Patient required direct (one-on-one) patient contact. Skilled physical therapy intervention and the application of clinical skills and/or services medically necessary to improve function.      Assessment: Pt endorses more relaxed pelvis/coccyx tightness compared to last session.   Pt seems well motivated and is a good candidate for PT intervention with emphasis on pelvic floor rehabilitation to help her/him improve bladder control.     Progress towards goals:     Short Term Goals: (in 4 weeks)    1. Independent with performance of a HEP of PFM on a daily basis. Goal met   2. Pt will be able to isolate PFM during contraction and not compensate with other muscle groups in order to improve strength and awareness of PFM Progressing   3. Pt will demonstrate proper bearing down in order to decrease risk for hemorrhoids Goal met   4. Pt will be able to sit with coccyx pain less than 2/10 in order to improve activity tolerance Progressing   5. Pt will demonstrate proper standing posture in order to decrease stresses on pelvis and pelvic floor Progressing     Long Term Goals:  (in 8 Weeks)  1. Pt will demonstrate use of functional PFM contraction by performing a  pre-contraction ("knack") to eliminate UI during a cough/sneeze  2.  Pt will demonstrate proper body mechanics with bending down in order to decrease strain on pelvic floor and spine  3. Patient with demonstrate an increase in PFM endurance to at least 10 seconds in order to improve strength  4. Pt will experience overall pain less than 2/10 with daily activities    Plan:PT once/week as schedules allow for 8 weeks  Adjustments to POC:       Charges Minutes/Units   PT re-eval     Ther ex $ PT Therapeutic Exercise (97110): 1 Unit   Ther act $ PT Therapeutic Activity (97530): 1 unit   Neuro Re-Ed     Manual $ PT Manual Therapy (81191): 2 Unit   Self Care     E-stim         Eber Hong, PT  Physical Therapist  [facility]  [place] License # [id number]  [email or web address]  [phone]  5:55 PM  10/26/2018

## 2018-11-01 ENCOUNTER — Ambulatory Visit
Admission: RE | Admit: 2018-11-01 | Discharge: 2018-11-01 | Disposition: A | Discharge status: Home / Self Care | Payer: No Typology Code available for payment source | Source: Ambulatory Visit | Attending: Radiation Oncology | Admitting: Radiation Oncology

## 2018-11-01 DIAGNOSIS — R102 Pelvic and perineal pain: Secondary | ICD-10-CM | POA: Insufficient documentation

## 2018-11-01 NOTE — PT Plan of Care Note (Addendum)
[facility]  Physical Therapy Pelvic Floor Treatment Note    Patient:   Jill Ross                                                        MRN#: [id number]  Date: PT Received On: 11/01/18    PT Visit Number: 6    Primary Diagnosis:      N 39.3 Stress Incontinence  R 10.2 Pelvic pain in female                             Ref. MD: Dr. Acie Fredrickson, MD  Rx Order:  Post-anal cancer chemoradiation therapy    PT POC signed by 4th visit:   yes  PT Current Certification Period: 09/26/2018 Through 12/25/2018  Progress report due: 11/30/18  Insurance: Medicare    Time of treatment:   Time of treatment: Time Calculation  PT Received On: 11/01/18  Start Time: 1400  Stop Time: 1451  Time Calculation (min): 51 min           Pt history: Pt diagnosed Sept 2019. Went through chemo and radiation; finished radiation 3rd week in November. Pt reports tightness, burning, itching in anus and vagina. Sitting: must have a cushion under her buttocks.  4/10 at worst (tightness).  Tightness in anus & vagina when bending down, also when putting legs in bathtub, she can feel tightness in pelvic area.  Also feels it with sit to stand.  Also seems like her tailbone protrudes more than it used to and it causes pain. Feels tailbone pain mostly when sitting. She's using silver cream rx from [place]; she doesn't always apply it.  Hysterectomy 1990. She will be traveling 3rd week of January to [place]; unsure how she will feel with the plane ride (3 hours). She has limited social activities since diagnosis and while recovering due to low energy.           Subjective: Pt reporting that sitting is improving (pt often leans to the right in sitting). Still with mild swelling but improved- will discuss with MD at next follow up. Plans to go out to [place] in 2 weeks.   Has total gym at home     Pain Level: does not rate today.       Objective/Measurements:    Observation/Posture: pt with forward shoulders posture, thoracic kyphosis.      Treatment performed with chaperone present per patient preference: yes  Pt. would like to pursue pelvic floor treatment plan discussed at this time. Patient agrees to internal exam/ treatment.  Internal exam consent on file. scanned in [employer] tab      Therapeutic Exercise:    Intervention necessary for  Flexibility/Rom to maximize lessen pain.    Therapeutic Exercise 1/7 10/11/18 10/19/18 10/26/2018 11/01/2018   Pelvic pain stretches -SKC  -SKC w/ ER  -PPT  -cat/cow  -child pose  -standing leg add  - standing hip flexor stretch -SKC  -SKC w/ ER  -PPT  -cat/cow  -squat holding onto counter  -standing leg add  - standing hip flexor stretch -skc  -skc w/ ER  - PPT  -squat holding onto counter  -standing leg add  - standing hip flexor stretch   -skc  -skc w/  ER  - PPT  -standing leg add  - standing hip flexor stretch Reviewed all pelvic pain stretches. Added standing add with spinal twist and standing add/hamstring combo to HEP   Pelvic stabilization exercises   -Bridging  -Bridging  Plan bridge w/ leg ext if indicated   Postural exercise   -supine thoracic stretch with ball between shoulder blades  - supine mountain pose to open chest with deep breathing.   --Lumbar stretch from raised table, added propped on elbows for thoracic stretch  -supine thoracic stretch with ball between shoulder blades  - supine mountain pose to open chest with deep breathing.   --Lumbar stretch from raised table, added propped on elbows for thoracic stretch  -supine thoracic stretch with ball between shoulder blades  - supine mountain pose to open chest with deep breathing.   --Lumbar stretch from raised table, added propped on elbows for thoracic stretch, P/A mobilizations   Leg press     Demo to pt exercises to be performed on Total [employer] using Leg press machine (2/2 seated position on Leg Press, therapist demo to pt):  -squats  -squats w/ LE ER  - squats w/ LE IR  -squat w/ slow eccentric   -S/L squat  -s/l squat w/ ER                        Manual Therapy:   Glut, sacrum and coccyx STM and skin rolling techniques in sidelying. Note increased tightness right ischial tuberosity (pt w/ tendency to sit on right IT)  P/A thoracic spine as noted above    Intervention necessary to treat restricted motion of soft tissues , lessen pain, reduce trigger points and iprove soft tissue and/or fascia mobility in pelvis and surrounding areas.        Functional Activities/ Therapeutic Activities:    - Standing posture: cueing for pelvis inferior to ribcage, weight equal throughout feet, chest slightly lifted, shoulders back slightly.  Discussed postural awareness checks throughout the day and ergonomics.   Plan lifting techniques next visit .      Education on Genital Hygiene and General Skin care.    Education on types of lubricants to assist with skin integrity and skin care.     Intervention necessary to improve functional activities (sitting) and self care to improve functional performance in a progressive manner.   They require the professional skills of providers and are designed to address specific functional need of patient.      Patient Education:  Educated on home exercise program, development and progression, next visit appointment and posture.  Patient verbalized understanding.       HEP    kegels As instructed    Pelvic pain stretches As instructed    Total gym As instructed          Other: Progress report   Patient required direct (one-on-one) patient contact. Skilled physical therapy intervention and the application of clinical skills and/or services medically necessary to improve function.      Assessment:  Pt improving to the point that she feels well enough to travel to [facility] in a few weeks. Progressing well.   Pt seems well motivated and is a good candidate for PT intervention with emphasis on pelvic floor rehabilitation to help her/him improve bladder control.     Progress towards goals:     Short Term Goals: (in 4 weeks)    1. Independent  with performance of a HEP of PFM  on a daily basis. Goal met   2. Pt will be able to isolate PFM during contraction and not compensate with other muscle groups in order to improve strength and awareness of PFM Progressing   3. Pt will demonstrate proper bearing down in order to decrease risk for hemorrhoids Goal met   4. Pt will be able to sit with coccyx pain less than 2/10 in order to improve activity tolerance Progressing   5. Pt will demonstrate proper standing posture in order to decrease stresses on pelvis and pelvic floor Progressing     Long Term Goals:  (in 8 Weeks)  1. Pt will demonstrate use of functional PFM contraction by performing a pre-contraction ("knack") to eliminate UI during a cough/sneeze  2. Pt will demonstrate proper body mechanics with bending down in order to decrease strain on pelvic floor and spine  3. Patient with demonstrate an increase in PFM endurance to at least 10 seconds in order to improve strength  4. Pt will experience overall pain less than 2/10 with daily activities    Plan:address Goals, lifting techniques  Adjustments to POC:       Charges Minutes/Units   PT re-eval     Ther ex $ PT Therapeutic Exercise (97110): 2 Units   Ther act     Neuro Re-Ed     Manual $ PT Manual Therapy (09811): 1 Unit   Self Care     E-stim         Eber Hong, PT  Physical Therapist  [facility]  [place] License # [id number]  [email or web address]  [phone]  3:42 PM  11/01/2018

## 2018-11-01 NOTE — Progress Notes (Signed)
NAME: Jill Ross  MRN: [id number] DOB: 01/29/1951 AGE:68 y.o.    [facility]   Physical Medicine and Rehabilitation  [address]  [place], [place] [address]  [phone]    PELVIC FLOOR PHYSICAL THERAPY PROGRESS NOTE  Date:  11/01/2018      PATIENT:  Jill Ross        MRN#: [id number]          DOB:  [birth year]  DIAGNOSIS:    Primary Diagnosis: N 39.3 Stress Incontinence  R 10.2 Pelvic pain in female  Ref. MD:Dr. Acie Fredrickson, MD  Rx Order:Post-anal cancer chemoradiation therapy    Potential for Improvement: excellent    DATES OF TREATMENT:     09/26/2018    to 11/01/2018     #VISITS:  6    Patient history: Pt diagnosed Sept 2019. Went through chemo and radiation; finished radiation 3rd week in November. Pt reports tightness, burning, itching in anus and vagina. Sitting: must have a cushion under her buttocks. 4/10 at worst (tightness). Tightness in anus &vagina when bending down, also when putting legs in bathtub, she can feel tightness in pelvic area. Also feels it with sit to stand. Also seems like her tailbone protrudes more than it used to and it causes pain. Feels tailbone pain mostly when sitting. She's using silver cream rx from [place]; she doesn't always apply it. Hysterectomy 1990. She will be traveling 3rd week of January to [place]; unsure how she will feel with the plane ride (3 hours). She has limited social activities since diagnosis and while recovering due to low energy.       Treatment Provided:     Interventions to include any of the following:    Pelvic Floor uptraining or downtraining depending on diagnosis.   Education on pelvic brace with cough/sneeze, in standing, in supine and with functional activities.   Manual therapy including intravaginal manual therapy to pelvic floor muscles.  Modalities of choice including PFM/TrA neuromuscular reeducation/feedback strategies with internal and/or external sensor for pelvic floor muscle  re-education.   Therapeutic Exercise   Therapeutic activity.   HEP development and progression.   Bladder/bowel retraining strategies including review bowel / bladder diary and education in control strategies    Education on general bladder/vulvar care to assist with homeostasis of affected regions   Instruction in functional constipation techniques    Education on Body mechanics, postural awareness instruction and/or lifting instruction   Oncology per protocol        Current Functional Status/Summary of Progress:    Pt improving with skin integrity and pain to irritated areas, improving sitting tolerance, improvement in tightness and able to move/perform functional mobility with less pain. Working toward goals at this time. Anticipate discharge in 3-4 weeks.         Reason for Continuing Treatment:    Patient  is well motivated and is a good candidate for PT intervention with emphasis on pelvic floor rehabilitation to help decrease her pain,  improve bladder/bowel control, improve functional activities, address posture and soft tissue limitations  and improve overall Quality of Life     Current Goals:  Short Term Goals: (in4weeks)   1. Independent with performance of a HEP of PFM on a daily basis. Goal met   2. Ptwill be able to isolate PFM during contraction and not compensate with other muscle groups in order to improve strength and awareness of PFM Progressing   3. Pt willdemonstrate proper bearing down in order to  decrease risk for hemorrhoids Goal met   4. Pt will be able to sit with coccyx pain less than 2/10 in order to improve activity tolerance Progressing   5. Pt will demonstrate proper standing posture in order to decrease stresses on pelvis and pelvic floor Progressing     Long Term Goals: (in 8Weeks)  1. Pt will demonstrate use of functional PFM contraction by performing a pre-contraction ("knack") to eliminate UI during a cough/sneeze  2.Pt will demonstrate proper body mechanics with  bending down in order to decrease strain on pelvic floor and spine  3. Patient with demonstrate an increase in PFM endurance toat least 10 seconds in order to improve strength  4. Pt will experience overall pain less than 2/10 with daily activities      Plan: cont POC      Eber Hong, PT  Physical Therapist  [facility]  [place] License # [id number]  [email or web address]  [phone]

## 2018-11-08 ENCOUNTER — Ambulatory Visit
Admission: RE | Admit: 2018-11-08 | Discharge: 2018-11-08 | Disposition: A | Discharge status: Home / Self Care | Payer: No Typology Code available for payment source | Source: Ambulatory Visit

## 2018-11-08 NOTE — PT Plan of Care Note (Signed)
[facility]  Physical Therapy Pelvic Floor Treatment Note    Patient:   Jill Ross                                                        MRN#: [id number]  Date: PT Received On: 11/08/18    PT Visit Number: 7    Primary Diagnosis:      N 39.3 Stress Incontinence  R 10.2 Pelvic pain in female                             Ref. MD: Dr. Acie Fredrickson, MD  Rx Order:  Post-anal cancer chemoradiation therapy    PT POC signed by 4th visit:   yes  PT Current Certification Period: 09/26/2018 Through 12/25/2018  Progress report due: 11/30/18  Insurance: Medicare    Time of treatment:   Time of treatment: Time Calculation  PT Received On: 11/08/18  Start Time: 1400  Stop Time: 1505  Time Calculation (min): 65 min           Pt history: Pt diagnosed Sept 2019. Went through chemo and radiation; finished radiation 3rd week in November. Pt reports tightness, burning, itching in anus and vagina. Sitting: must have a cushion under her buttocks.  4/10 at worst (tightness).  Tightness in anus & vagina when bending down, also when putting legs in bathtub, she can feel tightness in pelvic area.  Also feels it with sit to stand.  Also seems like her tailbone protrudes more than it used to and it causes pain. Feels tailbone pain mostly when sitting. She's using silver cream rx from MD; she doesn't always apply it.  Hysterectomy 1990. She will be traveling 3rd week of January to [place]; unsure how she will feel with the plane ride (3 hours). She has limited social activities since diagnosis and while recovering due to low energy.           Subjective:  Still with mild swelling but improved- will discuss with MD at next follow up. Feels well enough to travel now utilizing adaptive strategies.       Pain Level: minimal      Objective/Measurements:    Observation/Posture:     Treatment performed with chaperone present per patient preference: yes  Pt. would like to pursue pelvic floor treatment plan discussed at this time.  Patient agrees to internal exam/ treatment.  Internal exam consent on file. scanned in [employer] tab      Therapeutic Exercise:    Intervention necessary for  Flexibility/Rom to maximize lessen pain.    Therapeutic Exercise 1/7 10/11/18 10/19/18 10/26/2018 11/01/2018 11/08/2018   Pelvic pain stretches -SKC  -SKC w/ ER  -PPT  -cat/cow  -child pose  -standing leg add  - standing hip flexor stretch -SKC  -SKC w/ ER  -PPT  -cat/cow  -squat holding onto counter  -standing leg add  - standing hip flexor stretch -skc  -skc w/ ER  - PPT  -squat holding onto counter  -standing leg add  - standing hip flexor stretch   -skc  -skc w/ ER  - PPT  -standing leg add  - standing hip flexor stretch Reviewed all pelvic pain stretches. Added standing add with spinal twist and standing add/hamstring  combo to HEP Reviewed all pelvic pain stretches pt utilizes in her daily routine.   Provided resources for yoga for self management.    Pelvic stabilization exercises   -Bridging  -Bridging  Plan bridge w/ leg ext if indicated    Postural exercise   -supine thoracic stretch with ball between shoulder blades  - supine mountain pose to open chest with deep breathing.   --Lumbar stretch from raised table, added propped on elbows for thoracic stretch  -supine thoracic stretch with ball between shoulder blades  - supine mountain pose to open chest with deep breathing.   --Lumbar stretch from raised table, added propped on elbows for thoracic stretch  -supine thoracic stretch with ball between shoulder blades  - supine mountain pose to open chest with deep breathing.   --Lumbar stretch from raised table, added propped on elbows for thoracic stretch, P/A mobilizations Reviewed postural exercises and stretches.   Added: theraband (red>green) rows, arm extension, finning to HEP   Leg press     Demo to pt exercises to be performed on [employer] using Leg press machine (2/2 seated position on Leg Press, therapist demo to pt):  -squats  -squats w/ LE ER  -  squats w/ LE IR  -squat w/ slow eccentric   -S/L squat  -s/l squat w/ ER                          Manual Therapy:   Glut, sacrum and coccyx STM and skin rolling techniques in sidelying. Note increased tightness right ischial tuberosity (pt w/ tendency to sit on right IT)  P/A thoracic spine as noted above    Intervention necessary to treat restricted motion of soft tissues , lessen pain, reduce trigger points and iprove soft tissue and/or fascia mobility in pelvis and surrounding areas.        Functional Activities/ Therapeutic Activities:    - Standing posture: cueing for pelvis inferior to ribcage, weight equal throughout feet, chest slightly lifted, shoulders back slightly.  Discussed postural awareness checks throughout the day and ergonomics.     Lifting/carrying training and technique:  -pt educated in "quick lifting tips":  Before lifting, plan ahead, make sure path is dry and free of obstacles,, Bend knees and keep back straight (lift with legs not back),, Bring load close to body, and Lift in a slow, even motion,  . Get help when you need it  Provided verbal and tactile facilitation of proper form, inhibition of shoulder elevation compensation, facilitation of bending from hips and knees vs low back with lifting and carrying techniques.   Pt  able to teach back lifting techniques.        Education on Genital Hygiene and General Skin care.    Education on types of lubricants to assist with skin integrity and skin care.     Intervention necessary to improve functional activities (sitting) and self care to improve functional performance in a progressive manner.   They require the professional skills of providers and are designed to address specific functional need of patient.      Patient Education:  Educated on home exercise program, development and progression, and posture.  Patient verbalized understanding.       HEP    kegels As instructed    Pelvic pain stretches As instructed    Total gym As instructed     theraband Rows, shld ext, finning     Other: Progress report  Patient required direct (one-on-one) patient contact. Skilled physical therapy intervention and the application of clinical skills and/or services medically necessary to improve function.      Assessment:  Pt improving to the point that she feels well enough to travel using adaptive strategies. Patient with most goals achieved, will follow up when she returns from travel only if necessary.     Progress towards goals:     Short Term Goals: (in 4 weeks)    1. Independent with performance of a HEP of PFM on a daily basis. Goal met   2. Pt will be able to isolate PFM during contraction and not compensate with other muscle groups in order to improve strength and awareness of PFM Goal met   3. Pt will demonstrate proper bearing down in order to decrease risk for hemorrhoids Goal met   4. Pt will be able to sit with coccyx pain less than 2/10 in order to improve activity tolerance Progressing   5. Pt will demonstrate proper standing posture in order to decrease stresses on pelvis and pelvic floor Progressing     Long Term Goals:  (in 8 Weeks)  1. Pt will demonstrate use of functional PFM contraction by performing a pre-contraction ("knack") to eliminate UI during a cough/sneeze Goal met   2. Pt will demonstrate proper body mechanics with bending down in order to decrease strain on pelvic floor and spine Goal met   3. Patient with demonstrate an increase in PFM endurance to at least 10 seconds in order to improve strength Progressing   4. Pt will experience overall pain less than 2/10 with daily activities Goal met     Plan: follow up appt if needed, otherwise discharge  Adjustments to POC:       Charges Minutes/Units   PT re-eval     Ther ex $ PT Therapeutic Exercise (97110): 2 Units   Ther act $ PT Therapeutic Activity (97530): 1 unit   Neuro Re-Ed     Manual $ PT Manual Therapy (29562): 1 Unit   Self Care     E-stim         Eber Hong,  PT  Physical Therapist  [facility]  [place] License # [id number]  [email or web address]  [phone]  5:19 PM  11/08/2018

## 2018-11-20 ENCOUNTER — Encounter: Payer: Self-pay | Admitting: Hematology & Oncology

## 2018-11-21 ENCOUNTER — Other Ambulatory Visit: Payer: Self-pay

## 2018-11-21 DIAGNOSIS — C210 Malignant neoplasm of anus, unspecified: Secondary | ICD-10-CM

## 2018-11-22 ENCOUNTER — Other Ambulatory Visit: Payer: Self-pay | Admitting: Hematology & Oncology

## 2018-11-23 ENCOUNTER — Encounter: Payer: Self-pay | Admitting: Hematology & Oncology

## 2018-11-23 ENCOUNTER — Ambulatory Visit
Payer: No Typology Code available for payment source | Attending: Hematology & Oncology | Admitting: Hematology & Oncology

## 2018-11-23 ENCOUNTER — Other Ambulatory Visit (FREE_STANDING_LABORATORY_FACILITY): Payer: No Typology Code available for payment source

## 2018-11-23 VITALS — BP 124/81 | HR 76 | Temp 97.3°F | Resp 17 | Ht 64.0 in | Wt 138.2 lb

## 2018-11-23 DIAGNOSIS — C210 Malignant neoplasm of anus, unspecified: Secondary | ICD-10-CM

## 2018-11-23 LAB — CBC AND DIFFERENTIAL
Absolute NRBC: 0 10*3/uL (ref 0.00–0.00)
Basophils Absolute Automated: 0.05 10*3/uL (ref 0.00–0.08)
Basophils Automated: 0.9 %
Eosinophils Absolute Automated: 0.09 10*3/uL (ref 0.00–0.44)
Eosinophils Automated: 1.6 %
Hematocrit: 37.7 % (ref 34.7–43.7)
Hgb: 12.6 g/dL (ref 11.4–14.8)
Immature Granulocytes Absolute: 0.01 10*3/uL (ref 0.00–0.07)
Immature Granulocytes: 0.2 %
Lymphocytes Absolute Automated: 0.89 10*3/uL (ref 0.42–3.22)
Lymphocytes Automated: 16.1 %
MCH: 30.1 pg (ref 25.1–33.5)
MCHC: 33.4 g/dL (ref 31.5–35.8)
MCV: 90 fL (ref 78.0–96.0)
MPV: 9.7 fL (ref 8.9–12.5)
Monocytes Absolute Automated: 0.37 10*3/uL (ref 0.21–0.85)
Monocytes: 6.7 %
Neutrophils Absolute: 4.11 10*3/uL (ref 1.10–6.33)
Neutrophils: 74.5 %
Nucleated RBC: 0 /100 WBC (ref 0.0–0.0)
Platelets: 212 10*3/uL (ref 142–346)
RBC: 4.19 10*6/uL (ref 3.90–5.10)
RDW: 14 % (ref 11–15)
WBC: 5.52 10*3/uL (ref 3.10–9.50)

## 2018-11-23 LAB — COMPREHENSIVE METABOLIC PANEL
ALT: 20 U/L (ref 0–55)
AST (SGOT): 23 U/L (ref 5–34)
Albumin/Globulin Ratio: 1.1 (ref 0.9–2.2)
Albumin: 3.8 g/dL (ref 3.5–5.0)
Alkaline Phosphatase: 68 U/L (ref 37–106)
BUN: 20 mg/dL — ABNORMAL HIGH (ref 7.0–19.0)
Bilirubin, Total: 0.5 mg/dL (ref 0.1–1.2)
CO2: 22 mEq/L (ref 21–29)
Calcium: 9.4 mg/dL (ref 8.5–10.5)
Chloride: 106 mEq/L (ref 100–111)
Creatinine: 0.8 mg/dL (ref 0.4–1.5)
Globulin: 3.5 g/dL (ref 2.0–3.7)
Glucose: 95 mg/dL (ref 70–100)
Potassium: 4.4 mEq/L (ref 3.5–5.1)
Protein, Total: 7.3 g/dL (ref 6.0–8.3)
Sodium: 141 mEq/L (ref 136–145)

## 2018-11-23 LAB — GFR: EGFR: >60

## 2018-11-23 NOTE — Progress Notes (Addendum)
Subjective:         Anal cancer    06/20/2018 Initial Diagnosis     Anal cancer      07/05/2018 - 07/05/2018 Chemotherapy     fluorouracil (ADRUCIL) 6,920 mg in sodium chloride 0.9 % 388.4 mL chemo infusion CADD cassette, 4,000 mg/m2, Intravenous, Once - Over 96 hours, 0 of 1 cycle  mitoMYcin (MUTAMYCIN) 17.25 mg in sterile water (preservative free) 34.5 mL chemo infusion, 10 mg/m2, Intravenous, Once, 0 of 1 cycle      07/11/2018 -  Chemotherapy     fluorouracil (ADRUCIL) 6,800 mg in sodium chloride 0.9 % 250 mL chemo infusion CADD cassette, 4,000 mg/m2 = 6,800 mg, Intravenous, Once - Over 96 hours, 1 of 1 cycle  Administration: 6,800 mg (07/11/2018), 6,800 mg (08/08/2018)  mitoMYcin (MUTAMYCIN) 17 mg in sterile water (preservative free) 34 mL chemo infusion, 10 mg/m2 = 17 mg, Intravenous, Once, 1 of 1 cycle  Administration: 17 mg (07/11/2018)               Treatment History:  Chemo/rt with 5-FU/Mitomycin. This was completed in November 2019. cCR.    Interval History:  She feels well  Mild oral dryness  PET scan shows CR.     HPI:  68 year old presents for the evaluation of squamous cell carcinoma of the anus.    She presented with one month of anal pain and pressure in August 2019. Initially, she was treated for hemorrhoids without therapeutic effect. She was seen by Dr. Sena Hitch of colorectal surgery and was found to have 5-6 cm mass in the left lateral aspect of the anus.     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 A PET scan showed an intensely hypermetabolic anal mass with likely   prolapse 4.0 x 7.4 cm maximum SUV 18.4. There was mild hypermetabolism in the inferior vagina and right vaginal cuff. There were mildly enlarged inguinal and presacral lymph nodes with only very mild hypermetabolism (mostly with SUV under 3).    She started treatment in early October and completed in November.  Her February PET scan was within normal limits without evidence of residual disease.      PMH:  Anal  fissure  High cholesterol    Fam Hx:  Prostate cancer in 24s  Mother with cancer in 90s.    Social Hx:  Never smoker      Review of Systems   General ROS: negative for  fatigue or weight loss or anorexia  Respiratory ROS: no cough, shortness of breath, or wheezing  Gastrointestinal ROS: negative for - abdominal pain, constipation, diarrhea or nausea/vomiting  Head and Neck ROS: no thrush or mucositis  Cardiovascular ROS: No chest pain or palpitations  Derm ROS: No new rashes  CNS: Negative for new focal weakness or numbness.  Extremities: Negative worsening edema or cyanosis.  GU: Negative for incontinence of Dysuria  Skeleton: Negative for difficulty with ambulation or focal bone pain    The remainder of the patient's 14 point review of symptoms is negative       Objective:     Wt Readings from Last 3 Encounters:   11/23/18 62.7 kg (138 lb 3.2 oz)   09/16/18 59.9 kg (132 lb)   09/05/18 60 kg (132 lb 3.2 oz)   BP 124/81   Pulse 76   Temp 97.3 F (36.3 C) (Oral)   Resp 17   Ht 1.626 m (5\' 4" )   Wt 62.7 kg (138 lb 3.2  oz)   SpO2 99%   BMI 23.72 kg/m   Neck - supple, no significant adenopathy  Lymphatics - no palpable lymphadenopathy, no hepatosplenomegaly  Chest - clear to auscultation, no wheezes, rales or rhonchi, symmetric air entry  Heart - normal rate, regular rhythm, normal S1, S2, no murmurs, rubs, clicks or gallops  Abdomen - soft, nontender, nondistended, no masses or organomegaly  Extremities - peripheral pulses normal, no pedal edema, no clubbing or cyanosis, no pedal edema noted,no clubbing or erythema  Skin - normal coloration and turgor, no rashes, no suspicious skin lesions noted   rectal exam: hemorrhoids; no residual mass appreciated. There was significant inflammation/proctitis seen extending anteriorly on the perineum.    Rads:     No results found.         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 There were also mildly enlarged inguinal and presacral lymph nodes.  PET/CT scan in February 2020 showed no residual disease.    Follow up schedule  -DRE and inguinal node palpation every 3-6 months for 3 years (until October 2022)  -Anoscopy Q3-6 months. She is due for colonoscopy so I may refer her to Dr. Sena Hitch for that.  -yearly CT scan of chest/abdomen/pelvis each February until February 2023.    She may move to [place].      Radiaton Proctitis.  Trial of proctofoam in addition to anusol.  It is improving.    Goals of care:  Curative

## 2018-11-29 ENCOUNTER — Encounter: Payer: Self-pay | Admitting: Hematology & Oncology

## 2018-12-07 ENCOUNTER — Encounter: Payer: Self-pay | Admitting: Hematology & Oncology

## 2018-12-08 ENCOUNTER — Encounter: Payer: Self-pay | Admitting: Hematology & Oncology

## 2018-12-13 ENCOUNTER — Encounter: Payer: Self-pay | Admitting: Hematology & Oncology

## 2018-12-14 ENCOUNTER — Other Ambulatory Visit: Payer: Self-pay | Admitting: Cardiovascular Disease

## 2018-12-14 ENCOUNTER — Other Ambulatory Visit: Payer: Self-pay

## 2018-12-14 DIAGNOSIS — C210 Malignant neoplasm of anus, unspecified: Secondary | ICD-10-CM

## 2019-03-06 ENCOUNTER — Encounter: Payer: Self-pay | Admitting: Hematology & Oncology

## 2019-03-06 ENCOUNTER — Telehealth: Payer: Self-pay | Admitting: Hematology & Oncology

## 2019-03-06 ENCOUNTER — Ambulatory Visit
Payer: No Typology Code available for payment source | Attending: Hematology & Oncology | Admitting: Hematology & Oncology

## 2019-03-06 VITALS — Temp 98.1°F | Wt 142.0 lb

## 2019-03-06 DIAGNOSIS — C210 Malignant neoplasm of anus, unspecified: Secondary | ICD-10-CM

## 2019-03-06 NOTE — Progress Notes (Signed)
Subjective:         Anal cancer    06/20/2018 Initial Diagnosis     Anal cancer      07/05/2018 - 07/05/2018 Chemotherapy     fluorouracil (ADRUCIL) 6,920 mg in sodium chloride 0.9 % 388.4 mL chemo infusion CADD cassette, 4,000 mg/m2, Intravenous, Once - Over 96 hours, 0 of 1 cycle  mitoMYcin (MUTAMYCIN) 17.25 mg in sterile water (preservative free) 34.5 mL chemo infusion, 10 mg/m2, Intravenous, Once, 0 of 1 cycle      07/11/2018 -  Chemotherapy     fluorouracil (ADRUCIL) 6,800 mg in sodium chloride 0.9 % 250 mL chemo infusion CADD cassette, 4,000 mg/m2 = 6,800 mg, Intravenous, Once - Over 96 hours, 1 of 1 cycle  Administration: 6,800 mg (07/11/2018), 6,800 mg (08/08/2018)  mitoMYcin (MUTAMYCIN) 17 mg in sterile water (preservative free) 34 mL chemo infusion, 10 mg/m2 = 17 mg, Intravenous, Once, 1 of 1 cycle  Administration: 17 mg (07/11/2018)               Treatment History:  Chemo/rt with 5-FU/Mitomycin. This was completed in November 2019. cCR.    Interval History:  She feels well.  She is moving to [place] in February.       HPI:  68 year old presents for the evaluation of squamous cell carcinoma of the anus.    She presented with one month of anal pain and pressure in August 2019. Initially, she was treated for hemorrhoids without therapeutic effect. She was seen by Dr. Sena Hitch of colorectal surgery and was found to have 5-6 cm mass in the left lateral aspect of the anus.     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 A PET scan showed an intensely hypermetabolic anal mass with likely   prolapse 4.0 x 7.4 cm maximum SUV 18.4. There was mild hypermetabolism in the inferior vagina and right vaginal cuff. There were mildly enlarged inguinal and presacral lymph nodes with only very mild hypermetabolism (mostly with SUV under 3).    She started treatment in early October and completed in November.  Her February PET scan was within normal limits without evidence of residual disease.      PMH:  Anal  fissure  High cholesterol    Fam Hx:  Prostate cancer in 26s  Mother with cancer in 90s.    Social Hx:  Never smoker      Review of Systems   General ROS: negative for  fatigue or weight loss or anorexia  Respiratory ROS: no cough, shortness of breath, or wheezing  Gastrointestinal ROS: negative for - abdominal pain, constipation, diarrhea or nausea/vomiting  Head and Neck ROS: no thrush or mucositis  Cardiovascular ROS: No chest pain or palpitations  Derm ROS: No new rashes  CNS: Negative for new focal weakness or numbness.  Extremities: Negative worsening edema or cyanosis.  GU: Negative for incontinence of Dysuria  Skeleton: Negative for difficulty with ambulation or focal bone pain    The remainder of the patient's 14 point review of symptoms is negative       Objective:     Wt Readings from Last 3 Encounters:   03/06/19 64.4 kg (142 lb)   11/23/18 62.7 kg (138 lb 3.2 oz)   09/16/18 59.9 kg (132 lb)   Temp 98.1 F (36.7 C) (Oral)   Wt 64.4 kg (142 lb)   BMI 24.37 kg/m   No acute distress  Awake, alert, and oriented x3  No rash seen  Ambulatory  Conversant without confusion.    Rads:     No results found.         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 There were also mildly enlarged inguinal and presacral lymph nodes.  PET/CT scan in February 2020 showed no residual disease.    Follow up schedule  -DRE and inguinal node palpation every 3-6 months for 3 years (until October 2022)  -Anoscopy Q3-6 months. She is due for colonoscopy so I may refer her to Dr. Sena Hitch for that.  -yearly CT scan of chest/abdomen/pelvis each February until February 2023.    She plans to move to [place] in August.   I will do a physical exam prior to the time that she leaves.    Radiaton Proctitis.  Trial of proctofoam in addition to anusol.  It is improving.    Goals of  care:  Curative    Total time in teleconference and planning exceeded 20 minutes, more than half of which was in face to face encounter.  Verbal consent has been obtained from the patient to conduct a televisit to minimize exposure to COVID-19:Yes

## 2019-03-06 NOTE — Telephone Encounter (Signed)
appt on August 1st. Have her get CBC, CMP downstairs just prior to appt.

## 2019-03-07 ENCOUNTER — Encounter: Payer: Self-pay | Admitting: Radiation Oncology

## 2019-03-07 ENCOUNTER — Ambulatory Visit: Payer: Self-pay | Admitting: Radiation Oncology

## 2019-03-07 ENCOUNTER — Ambulatory Visit
Admission: RE | Admit: 2019-03-07 | Discharge: 2019-03-07 | Disposition: A | Discharge status: Home / Self Care | Payer: No Typology Code available for payment source | Source: Ambulatory Visit | Attending: Colon & Rectal Surgery | Admitting: Colon & Rectal Surgery

## 2019-03-07 DIAGNOSIS — C210 Malignant neoplasm of anus, unspecified: Secondary | ICD-10-CM | POA: Insufficient documentation

## 2019-03-07 NOTE — Telephone Encounter (Signed)
Appointments scheduled

## 2019-03-07 NOTE — Progress Notes (Signed)
03/07/19 1301   Comfort Alteration   Karnofsky Performance Score 100%-normal, no complaints   Fatigue 1   Pain Score 0   Nutrition Alteration   Anorexia 0-none   Nausea 0-none   Vomiting 0-none   Weight 65.5 kg (144 lb 4.8 oz)   Elimination Alteration   Constipation 0 - None   Diarrhea 0-none   Urinary Frequency, Urgency 0-normal   Urinary Incontinence 0-absent   Dysuria 0-none   Skin Alteration   Radiation Dermatitis 0-none   Vital signs   Temp 98.2 F (36.8 C)   Temp Source Oral   Heart Rate 67   Resp Rate 18   BP 121/78   Fall Risk           Low      Anal cancer S/P chemo/radiation.  Completed radiation 08/23/2018.  Patient is doing well.  No pain, diarrhea or constipation.  Patient states that her labia seems a little swollen and she has some dribbllng of urine which requires wearing a pad.  She states using the vaginal dilator one or twice per month.  She is not sexually active.  Had telemedicine appointment with Dr. Eino Farber.  Last PET scan was 10/2018.  Patient states she will be moving to [place] soon.

## 2019-03-07 NOTE — Progress Notes (Signed)
RADIATION ONCOLOGY FOLLOW-UP NOTE    Date of Service: 03/07/2019    Referring Physician: Dr. Lady Gary    HISTORY OF PRESENT ILLNESS:   CC: Anal cancer (C21.0)    Jill Ross was seen today for a routine follow-up visit.  As you recall, she is a 68 y.o. 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    Oncologic History:   She presented to her primary care physicianin 6/2019with intermittent anal pain with hematochezia, initially attributed to hemorrhoids. This was treated with hemorrhoid cream without improvement in symptoms. Her last colonoscopy was about 5 years prior, when polypswereremoved.She eventually was referred to Dr. Tamera Punt in 05/2018. Digital rectal exam revealed prolapsed anal mass in the left lateral quadrant extending into the canal measuring about 5-6 cm. Biopsy of anal mass revealed moderately differentiated invasive squamous cell carcinoma.She met with Dr. Lady Gary and Dr. Iran Planas on 06/20/18 and definitive chemoradiation was recommended after PET-CT. She underwent PET-CT on 06/27/18 that revealed a 4 X 7.4 cm anal mass with likely prolapse (SUV 18.4), mild pre-sacral soft tissue thickening and stranding with low level uptake, anterior margin against vagina difficult to define, asymmetric soft tissue density posteriorly along with right vaginal cuff 1.7 X 2.6 cm (SUV 7.6), left perirectal 0.8 cm and left pre-sacral LN 0.7 cm SUV 1.2 for both.  Left inguinal LN 1.5 cm (SUV 2.2). There was a 3.1 cm right adnexal cyst. MRI pelvis on 07/05/18 revealed extensive enhancing mass involving the lower rectum and anus extending into adjacent fat, right vagianl cuff area with 2.8 X 1.4 cm enahncing mass c/w disease, no iliac/inguinal adenopathy.  She lives in [place] and preferred radiotherapy locally.  She received 45 Gy in 30 fractions to the pelvis with a simultaneous integrated boost to  the primary involved lymph nodes less than 3 cm in size to 50.4 Gy in 30 fractions as well as the primary anal tumor to 54 Gy in 30 fractions.  Her entire treatment course was completed on August 23, 2018.  She did receive concurrent 5-FU and mitomycin-C with Dr. Lady Gary.  She underwent restaging PET/CT on November 22, 2018 that revealed subtle increased FDG activity in the anal region with associated thickening which may be secondary to inflammation from radiation changes, previously identified FDG avid soft tissue along the posterior right aspect of the vaginal cuff is no longer visualized, and no evidence of distant metastatic disease.    She presents today feeling well. She notes mild vulvar edema, urinary dribbling at times. She denies fecal incontinence or rectal bleeding.  She notes urinary frequency, no nocturia, dysuria, hematuria, N/V, weight loss, diarrhea, constipation, or dyschezia.  She is currently not sexually active.  She uses the medium vaginal dilator about 1-2 times a month.  She continues to see Dr. Sena Hitch but hasn't had colonoscopy since RT completion given the coronavirus pandemic stopping such procedures.  She denies SOB, cough, wheezing, chest pain, palpitations, abdominal pain, N/V, headaches, vision changes, seizures, numbness/tingling, focal weakness, ataxia, falls, or bony pain. No other complaints.    I have reviewed Jill Ross's medical, surgical and other pertinent history in detail, and have updated medication and allergy information in the electronic medical record.    ROS: Pertinent ROS as noted above. Otherwise negative or non-contributory.     PHYSICAL EXAM:   BP 121/78 (BP Site: Right arm)   Pulse 67   Temp  98.2 F (36.8 C) (Oral)   Resp 18   Wt 65.5 kg (144 lb 4.8 oz)   SpO2 98%   BMI 24.77 kg/m   ECOG: 0  PAIN SCORE: 0  PAIN INTERVENTION: None  CONSTITUTIONAL: This is a well developed, well-nourished female in NAD, appears stated age.   HEENT: NC/AT, PERRL, EOMI,  sclera anicteric. MMM, no oral lesions.   NECK: Supple, with no thyromegaly, and non-tender. No tracheal deviation. No cervical or SCL LAD  CARDIAC: Regular rate and rhythm. Normal S1, S2. No murmurs, rubs or gallops.   PULMONARY: Lungs are CTAB without w/r/r.    ABDOMINAL: soft, NT, ND. No HSM. Normoactive bs throughout. No guarding, rebound.   RECTAL/PELVIS: Perianal area with minimal external hemorrhoids and left sided radiation fibrosis with no palpable anal masses, normal rectal tone, and no blood in rectal vault.  Pelvic exam revealed mild edema of mons pubis and labia majora with no vaginal stenosis/fibrosis, well-healed vaginal cuff with no visible or palpable masses on speculum and bimanual exams.  No inguinal adenopathy b/l  BACK: Straight & aligned, no CVA tenderness. Axial skeleton non-tender to percussion.   EXTREMITIES: Full ROM in all extremities, particularly b/l LE. No c/c/e. No lymphedema        LABS:   Lab Results   Component Value Date    WBC 5.52 11/23/2018    HGB 12.6 11/23/2018    HCT 37.7 11/23/2018    MCV 90.0 11/23/2018    PLT 212 11/23/2018     Lab Results   Component Value Date    NA 141 11/23/2018    K 4.4 11/23/2018    CL 106 11/23/2018    CO2 22 11/23/2018       RADIOLOGY: see HPI, PET in 10/2018      ASSESSMENT AND PLAN:   68 y.o. 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    She is recovering well from the acute side effects of radiotherapy.  She has no vaginal stenosis and can stop using the vaginal dilator.  She has no evidence of recurrent disease on exam and imaging.  She will continue to follow with medical oncology and CRS.  She needs to call and schedule a colonoscopy before leaving for [place].  She will have a repeat CT C/A/P in 10/2019 and f/u with Dr. Lady Gary next in 04/2019 before moving to [place].  I will see her back in 10/2018 when she comes back to see Dr Lady Gary after  her imaging.  She will then plan to fully transition her care to [place].  She was provided contact information should she have any questions in the interim.    Thank you for allowing me to participate in the care of this patient.     25 minutes of 30 minutes were spent in direct patient counseling and coordination of care.     Acie Fredrickson, MD MS  [facility]  [facility]

## 2019-04-28 ENCOUNTER — Other Ambulatory Visit: Payer: Self-pay

## 2019-04-28 ENCOUNTER — Encounter: Payer: Self-pay | Admitting: Hematology & Oncology

## 2019-04-28 ENCOUNTER — Telehealth: Payer: Self-pay | Admitting: Hematology & Oncology

## 2019-04-28 DIAGNOSIS — C210 Malignant neoplasm of anus, unspecified: Secondary | ICD-10-CM

## 2019-04-28 NOTE — Telephone Encounter (Signed)
Pt confirmed appt and is SCREENED for covid (ipe) 07/31

## 2019-05-01 ENCOUNTER — Telehealth: Payer: Self-pay

## 2019-05-01 ENCOUNTER — Encounter: Payer: Self-pay | Admitting: Hematology & Oncology

## 2019-05-01 ENCOUNTER — Other Ambulatory Visit (FREE_STANDING_LABORATORY_FACILITY): Payer: No Typology Code available for payment source

## 2019-05-01 ENCOUNTER — Ambulatory Visit
Payer: No Typology Code available for payment source | Attending: Hematology & Oncology | Admitting: Hematology & Oncology

## 2019-05-01 VITALS — BP 132/81 | HR 90 | Temp 98.5°F | Ht 64.0 in | Wt 143.6 lb

## 2019-05-01 DIAGNOSIS — C210 Malignant neoplasm of anus, unspecified: Secondary | ICD-10-CM

## 2019-05-01 LAB — HEMOLYSIS INDEX: Hemolysis Index: 16 (ref 0–18)

## 2019-05-01 LAB — COMPREHENSIVE METABOLIC PANEL
ALT: 21 U/L (ref 0–55)
AST (SGOT): 22 U/L (ref 5–34)
Albumin/Globulin Ratio: 1.2 (ref 0.9–2.2)
Albumin: 4.1 g/dL (ref 3.5–5.0)
Alkaline Phosphatase: 74 U/L (ref 37–106)
Anion Gap: 9 (ref 5.0–15.0)
BUN: 15.1 mg/dL (ref 7.0–19.0)
Bilirubin, Total: 0.6 mg/dL (ref 0.1–1.2)
CO2: 26 mEq/L (ref 21–29)
Calcium: 9.3 mg/dL (ref 8.5–10.5)
Chloride: 107 mEq/L (ref 100–111)
Creatinine: 0.9 mg/dL (ref 0.4–1.5)
Globulin: 3.3 g/dL (ref 2.0–3.7)
Glucose: 89 mg/dL (ref 70–100)
Potassium: 4.6 mEq/L (ref 3.5–5.1)
Protein, Total: 7.4 g/dL (ref 6.0–8.3)
Sodium: 142 mEq/L (ref 136–145)

## 2019-05-01 LAB — CBC AND DIFFERENTIAL
Absolute NRBC: 0 10*3/uL (ref 0.00–0.00)
Basophils Absolute Automated: 0.03 10*3/uL (ref 0.00–0.08)
Basophils Automated: 0.7 %
Eosinophils Absolute Automated: 0.15 10*3/uL (ref 0.00–0.44)
Eosinophils Automated: 3.7 %
Hematocrit: 40.5 % (ref 34.7–43.7)
Hgb: 13.7 g/dL (ref 11.4–14.8)
Immature Granulocytes Absolute: 0.01 10*3/uL (ref 0.00–0.07)
Immature Granulocytes: 0.2 %
Lymphocytes Absolute Automated: 0.84 10*3/uL (ref 0.42–3.22)
Lymphocytes Automated: 20.8 %
MCH: 30.1 pg (ref 25.1–33.5)
MCHC: 33.8 g/dL (ref 31.5–35.8)
MCV: 89 fL (ref 78.0–96.0)
MPV: 10 fL (ref 8.9–12.5)
Monocytes Absolute Automated: 0.31 10*3/uL (ref 0.21–0.85)
Monocytes: 7.7 %
Neutrophils Absolute: 2.69 10*3/uL (ref 1.10–6.33)
Neutrophils: 66.9 %
Nucleated RBC: 0 /100 WBC (ref 0.0–0.0)
Platelets: 226 10*3/uL (ref 142–346)
RBC: 4.55 10*6/uL (ref 3.90–5.10)
RDW: 14 % (ref 11–15)
WBC: 4.03 10*3/uL (ref 3.10–9.50)

## 2019-05-01 LAB — LIPID PANEL
Cholesterol / HDL Ratio: 3.8
Cholesterol: 235 mg/dL — ABNORMAL HIGH (ref 0–199)
HDL: 62 mg/dL (ref 40–9999)
LDL Calculated: 135 mg/dL — ABNORMAL HIGH (ref 0–99)
Triglycerides: 188 mg/dL — ABNORMAL HIGH (ref 34–149)
VLDL Calculated: 38 mg/dL (ref 10–40)

## 2019-05-01 LAB — GFR: EGFR: >60

## 2019-05-01 NOTE — Progress Notes (Addendum)
Subjective:         Anal cancer    06/20/2018 Initial Diagnosis     Anal cancer      07/05/2018 - 07/05/2018 Chemotherapy     fluorouracil (ADRUCIL) 6,920 mg in sodium chloride 0.9 % 388.4 mL chemo infusion CADD cassette, 4,000 mg/m2, Intravenous, Once - Over 96 hours, 0 of 1 cycle  mitoMYcin (MUTAMYCIN) 17.25 mg in sterile water (preservative free) 34.5 mL chemo infusion, 10 mg/m2, Intravenous, Once, 0 of 1 cycle      07/11/2018 -  Chemotherapy     fluorouracil (ADRUCIL) 6,800 mg in sodium chloride 0.9 % 250 mL chemo infusion CADD cassette, 4,000 mg/m2 = 6,800 mg, Intravenous, Once - Over 96 hours, 1 of 1 cycle  Administration: 6,800 mg (07/11/2018), 6,800 mg (08/08/2018)  mitoMYcin (MUTAMYCIN) 17 mg in sterile water (preservative free) 34 mL chemo infusion, 10 mg/m2 = 17 mg, Intravenous, Once, 1 of 1 cycle  Administration: 17 mg (07/11/2018)               Treatment History:  Chemo/rt with 5-FU/Mitomycin. This was completed in November 2019. cCR.    Interval History:  She feels well.         HPI:  68 year old presents for the evaluation of squamous cell carcinoma of the anus.    She presented with one month of anal pain and pressure in August 2019. Initially, she was treated for hemorrhoids without therapeutic effect. She was seen by Dr. Sena Hitch of colorectal surgery and was found to have 5-6 cm mass in the left lateral aspect of the anus.     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 A PET scan showed an intensely hypermetabolic anal mass with likely   prolapse 4.0 x 7.4 cm maximum SUV 18.4. There was mild hypermetabolism in the inferior vagina and right vaginal cuff. There were mildly enlarged inguinal and presacral lymph nodes with only very mild hypermetabolism (mostly with SUV under 3).    She started treatment in early October and completed in November.  Her February PET scan was within normal limits without evidence of residual disease.      PMH:  Anal fissure  High cholesterol    Fam  Hx:  Prostate cancer in 65s  Mother with cancer in 90s.    Social Hx:  Never smoker      Review of Systems   General ROS: negative for  fatigue or weight loss or anorexia  Respiratory ROS: no cough, shortness of breath, or wheezing  Gastrointestinal ROS: negative for - abdominal pain, constipation, diarrhea or nausea/vomiting  Head and Neck ROS: no thrush or mucositis  Cardiovascular ROS: No chest pain or palpitations  Derm ROS: No new rashes  CNS: Negative for new focal weakness or numbness.  Extremities: Negative worsening edema or cyanosis.  GU: Negative for incontinence of Dysuria  Skeleton: Negative for difficulty with ambulation or focal bone pain    The remainder of the patient's 14 point review of symptoms is negative       Objective:     Wt Readings from Last 3 Encounters:   05/01/19 65.1 kg (143 lb 9.6 oz)   03/07/19 65.5 kg (144 lb 4.8 oz)   03/06/19 64.4 kg (142 lb)   BP 132/81   Pulse 90   Temp 98.5 F (36.9 C)   Ht 1.626 m (5\' 4" )   Wt 65.1 kg (143 lb 9.6 oz)   SpO2 95%   BMI 24.65 kg/m  General: No acute distress  HEENT: no thrush seen. No oropharyngeal masses.  No lymphadenopathy in neck appreciated.  Lungs clear to auscultation B/L.  Regular Rate and rhythm.  Abdomen soft, non-tender, non-distended.  No clubbing, cyanosis, or edema.  Neuro exam without focal weakness.  Anal exam without focal mass    Rads:     No results found.         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 There were also mildly enlarged inguinal and presacral lymph nodes.  PET/CT scan in February 2020 showed no residual disease.  Her exam today is normal.  -She is moving to [place], [place] and will follow up with oncology and CRS there.  -I think it would be reasonable to remove her mediport before she leaves.    Follow up schedule  -DRE and inguinal node palpation every  3-6 months for 3 years (until October 2022)  -Anoscopy Q3-6 months. She is due for colonoscopy so I may refer her to Dr. Sena Hitch for that.  -yearly CT scan of chest/abdomen/pelvis each February until February 2023.    She plans to move to [place] in August.   I will do a physical exam prior to the time that she leaves.    Radiaton Proctitis.  Resolving    External hemorrhoids:  Not too bothersome to her but very prominent on exam.  No intervention at this time.    Cancer screening:  Normal colonoscopy in June 2020    Goals of care:  Curative

## 2019-05-01 NOTE — Telephone Encounter (Signed)
Needs mediport removal if it could be done at [facility] please schedule it there.. Call her with info [phone]. Please schd within the next 1-2 weeks

## 2019-05-02 ENCOUNTER — Other Ambulatory Visit: Payer: Self-pay

## 2019-05-02 DIAGNOSIS — C210 Malignant neoplasm of anus, unspecified: Secondary | ICD-10-CM

## 2019-05-02 NOTE — Telephone Encounter (Signed)
Attempted to call patient, phone went directly to VM.    Left message to call back, Thereasa Parkin has 2 dates available for pt to have mediport removed. Pt will need COVID-19 screening prior to removal.

## 2019-05-04 ENCOUNTER — Telehealth: Payer: Self-pay | Admitting: Hematology & Oncology

## 2019-05-04 DIAGNOSIS — C210 Malignant neoplasm of anus, unspecified: Secondary | ICD-10-CM

## 2019-05-04 NOTE — Telephone Encounter (Signed)
Received call from patient to follow up  On port removal. haven't hard anything   Calling to follow up. Please assist

## 2019-05-08 ENCOUNTER — Encounter: Payer: Self-pay | Admitting: Hematology & Oncology

## 2019-05-08 NOTE — Telephone Encounter (Signed)
Spoke to patient, reviewed pre-procedure instructions, patient expressed understanding, in agreement with plan, no further questions or concerns at this time.

## 2019-05-08 NOTE — Telephone Encounter (Signed)
[facility] does not take patient's insurance, scheduled mediport removal at [facility] on 8/18 at 8:30, arrive at 7:00. NPO after midnight, COVID test will be done 8/15. Left voicemail for patient, asked to call back to receive pre-procedure instructions.

## 2019-05-12 ENCOUNTER — Ambulatory Visit (FREE_STANDING_LABORATORY_FACILITY): Payer: No Typology Code available for payment source

## 2019-05-12 DIAGNOSIS — Z01818 Encounter for other preprocedural examination: Secondary | ICD-10-CM

## 2019-05-13 LAB — COVID-19 (SARS-COV-2): SARS CoV 2 Overall Result: NOT DETECTED

## 2019-05-15 ENCOUNTER — Ambulatory Visit: Payer: No Typology Code available for payment source

## 2019-05-15 NOTE — Pre-Procedure Instructions (Signed)
   email sent to ordering MD office RN asking if pt could have another covid 05/16/2019 as covid test done 05/12/2019--outside 96 hour guideline

## 2019-05-15 NOTE — Pre-Procedure Instructions (Signed)
   Procedure verified mediport removal 05/17/2019   Pt ID verified   Pt verbalized understanding of NPO instructions:  NPO after MN - read back   Arrival/Procedure time reviewed:  0700/0830 read back    Procedure location:  Park in [employer] [place] garage.  [facility] ground level, EP/Cath Lab check-in    Labs done 05/01/2019   COVID testing done 05/13/2019   Pt answered no to all COVID-19 screening questions    Ride arranged with husband Harleyquinn Gasser

## 2019-05-16 ENCOUNTER — Telehealth: Payer: Self-pay

## 2019-05-16 NOTE — Telephone Encounter (Signed)
Received call from [place] with pre procedure stating that the COVID screen on 8/14 is out of the 96 hour guideline.     States that if patient has been in strict self isolation we may be able to use this test. Otherwise she will need a new one. Return call to [place] at [phone].    Called patient. She states that she has been in isolation since her COVID screen Has not seen anyone, had anyone come to her home or left her home. She states she was told to go Friday for this screen.    Called Consuella Lose and notified her of same. She will review with Anesthesia.

## 2019-05-16 NOTE — Pre-Procedure Instructions (Addendum)
   Chart sent for [facility] an[facility] review as covid testing done 05/12/2019 for 05/17/2019 procedure--outside current 96 hour guideline   anes response:  No need for repeat covid prior to 05/17/2019 procedure

## 2019-05-16 NOTE — Pre-Procedure Instructions (Signed)
   Spoke to [place] in onc office.  Asked her to call patient and ensure she has been self-isolating since 05/12/2019 covid test.  She stated she will call patient then call [facility] with result.

## 2019-05-17 ENCOUNTER — Ambulatory Visit
Admission: RE | Admit: 2019-05-17 | Discharge: 2019-05-17 | Disposition: A | Discharge status: Home / Self Care | Payer: No Typology Code available for payment source | Source: Ambulatory Visit | Attending: Diagnostic Radiology | Admitting: Diagnostic Radiology

## 2019-05-17 ENCOUNTER — Encounter
Admission: RE | Disposition: A | Discharge status: Home / Self Care | Payer: Self-pay | Source: Ambulatory Visit | Attending: Diagnostic Radiology

## 2019-05-17 DIAGNOSIS — Z9221 Personal history of antineoplastic chemotherapy: Secondary | ICD-10-CM | POA: Insufficient documentation

## 2019-05-17 DIAGNOSIS — R05 Cough: Secondary | ICD-10-CM | POA: Insufficient documentation

## 2019-05-17 DIAGNOSIS — E785 Hyperlipidemia, unspecified: Secondary | ICD-10-CM | POA: Insufficient documentation

## 2019-05-17 DIAGNOSIS — B159 Hepatitis A without hepatic coma: Secondary | ICD-10-CM | POA: Insufficient documentation

## 2019-05-17 DIAGNOSIS — Z79899 Other long term (current) drug therapy: Secondary | ICD-10-CM | POA: Insufficient documentation

## 2019-05-17 DIAGNOSIS — C210 Malignant neoplasm of anus, unspecified: Secondary | ICD-10-CM | POA: Insufficient documentation

## 2019-05-17 HISTORY — PX: MEDIPORT REMOVAL: IMG2600

## 2019-05-17 HISTORY — DX: Hyperlipidemia, unspecified: E78.5

## 2019-05-17 SURGERY — MEDIPORT REMOVAL
Anesthesia: Conscious Sedation

## 2019-05-17 MED ORDER — MIDAZOLAM HCL 1 MG/ML IJ SOLN (WRAP)
INTRAMUSCULAR | Status: AC | PRN
Start: 2019-05-17 — End: 2019-05-17
  Administered 2019-05-17: 1 mg via INTRAVENOUS

## 2019-05-17 MED ORDER — HEPARIN (PORCINE) IN NACL 2000-0.9 UNIT/L-% IV SOLN
INTRAVENOUS | Status: AC
Start: 2019-05-17 — End: ?
  Filled 2019-05-17: qty 1000

## 2019-05-17 MED ORDER — FENTANYL CITRATE (PF) 50 MCG/ML IJ SOLN (WRAP)
INTRAMUSCULAR | Status: AC | PRN
Start: 2019-05-17 — End: 2019-05-17
  Administered 2019-05-17: 50 ug via INTRAVENOUS

## 2019-05-17 MED ORDER — LIDOCAINE HCL 1 % IJ SOLN
INTRAMUSCULAR | Status: AC
Start: 2019-05-17 — End: ?
  Filled 2019-05-17: qty 20

## 2019-05-17 MED ORDER — FENTANYL CITRATE (PF) 50 MCG/ML IJ SOLN (WRAP)
INTRAMUSCULAR | Status: AC
Start: 2019-05-17 — End: ?
  Filled 2019-05-17: qty 4

## 2019-05-17 MED ORDER — SODIUM CHLORIDE 0.9 % IV SOLN
INTRAVENOUS | Status: DC
Start: 2019-05-17 — End: 2019-05-17

## 2019-05-17 MED ORDER — LIDOCAINE HCL 1 % IJ SOLN
INTRAMUSCULAR | Status: AC | PRN
Start: 2019-05-17 — End: 2019-05-17
  Administered 2019-05-17: 10 mL

## 2019-05-17 MED ORDER — MIDAZOLAM HCL 1 MG/ML IJ SOLN (WRAP)
INTRAMUSCULAR | Status: AC
Start: 2019-05-17 — End: ?
  Filled 2019-05-17: qty 4

## 2019-05-17 NOTE — Progress Notes (Signed)
Outpatient arrived from home to [facility] room 6 to pre-op for right chest mediport removal by Dr. Leslie Andrea. Verified pt identity and procedure. NPO status. VSS. Denies pain and discomfort. Oriented pt to call bell with call bell in reach. Accompanied by husband who will be waiting in the [facility] waiting area during procedure.     COVID negative on 05/12/19. Informed Dr. Gustavus Bryant that COVID result is greater than 43 days old. Dr. Gustavus Bryant stated he does not need another test to be done since patient is asymptomatic.

## 2019-05-17 NOTE — Sedation Documentation (Signed)
1015Pt brought to  IR Room 3.  Threshold Pause complete.  Assisted to table, arm boards applied, and pressure points padded. Monitors and oxygen applied.  See [facility] report for vitals.  Skin prepped and draped per protocol by IR tech.      1055 mediport removed.   Pt tolerated well.  De-breif completed. Report to accepting nurse.      Sedation given:  2 mg Versed                         100  mcg Fentanyl

## 2019-05-17 NOTE — Brief Op Note (Signed)
BRIEF IR PROCEDURE NOTE    Date Time: 05/17/19 11:44 AM    Patient Name:   Jill Ross    Date of Operation:   05/17/2019    Providers Performing:   Surgeon(s):  Adelina Mings, MD    Assistant (s):    Operative Procedure:   Procedure(s):  MEDIPORT REMOVAL    Preoperative Diagnosis:   Pre-Op Diagnosis Codes:     * Anal cancer [C21.0]    Postoperative Diagnosis:   * No post-op diagnosis entered *    Anesthesia:   (x  ) FENTANYL  ( x ) VERSED  (x  ) LOCAL  (  ) GENERAL ANESTHESIA (DEPT OF ANESTHESIOLOGY) )    Estimated Blood Loss:    None.       CONTRAST   None.     RADIATION DOSE   None.     Findings:   Right IJV port removed in its entirety.     Complications:   None.       Signed by: Adelina Mings, MD                                                                              [facility]

## 2019-05-17 NOTE — UM Notes (Signed)
outpt status    Admitted on 8/19 for right chest mediport removal    Jill Ross  UR Case Manager, RN, BSN  [facility]  [phone]  [email or web address]

## 2019-05-17 NOTE — Discharge Instr - AVS First Page (Signed)
Interventional Cardiovascular Admission and Recovery        Interventional Radiology  Discharge Instructions for Mediport Removal     Your Mediport has been removed today by Dr. Gustavus Bryant    After the port is removed, the pocket is closed with absorbable sutures that do not have to be removed.  The skin is then covered with steristrips, which will fall off on their own (usually in 5-10 days).    1. DO NOT GET THE INCISION WET FOR 24  HOURS.  Then shower or bathe as usual, but do not submerge the incision in water until completely healed (approximately 7 days).   2. Keep the area clean and dry.   3. Do not scrub the incision. Gently wash with soap and water.  4. After bathing, pat the area dry with a soft towel.  5. It is not necessary to place a bandage over the incision.   6. Do not scratch, rub, or pick at the steristrips.  7. Do not apply liquids (such as peroxide), ointments, or creams to the incision while the steristrips are in place.  8. Most incisions heal without problems. However, an infection sometimes occurs despite proper treatment.  Observe for signs of infection:    a. redness, warmth, swelling, drainage, or temperature greater than 101 degrees F.    b. If you suspect infection, call the doctor who performed the procedure.    You may experience soreness or mild pain at the site for a couple days.  You may take Acetaminophen (Tylenol) if needed. You may also apply a cool pack.    If you have questions or concerns, please call an Interventional Radiologist:    Contact Numbers:    Regular business hours (8A-5P M-F):  [facility]: [phone] option 3    After hours:  Answering service:  [phone]

## 2019-05-17 NOTE — H&P (Signed)
BRIEF IR HISTORY AND PHYSICAL    Date Time: 05/17/19 7:48 AM    INDICATIONS:   Procedure(s):  MEDIPORT REMOVAL      PROCEDURALIST COMMENTS BELOW:   55F presenting for port removal.     PAST MEDICAL HISTORY:     Past Medical History:   Diagnosis Date   . Dry cough Since 04/2018   . Gall stones    . Hepatitis 1963    "A" at 51yrs of age   . Hyperlipidemia    . Malignant neoplasm 04/2018    Anal Cancer        PAST SURGICAL HISTORY     Past Surgical History:   Procedure Laterality Date   . COLONOSCOPY  Last 01/2013    01/2019 - second    . HYSTERECTOMY  1990   . LAPAROSCOPIC, CHOLECYSTECTOMY, CHOLANGIOGRAM N/A 12/10/2014    Procedure: LAPAROSCOPIC CHOLECYSTECTOMY WITH CHOLANGIOGRAM;  Surgeon: Rickey Primus, MD;  Location: [facility];  Service: General;  Laterality: N/A;   . MEDIPORT PLACEMENT N/A 06/28/2018    Procedure: MEDIPORT PLACEMENT;  Surgeon: Rex Kras, MD;  Location: [facility];  Service: Interventional Radiology;  Laterality: N/A;  NO blood thinners or diabetes (only baby aspirin)  KRISTEN [phone]  Order Vickie Epley in epic  [facility] does not take her insurance - HMO PLAN   . TUBAL LIGATION  1988       Family History:     Family History   Problem Relation Age of Onset   . Colon cancer Mother 39   . Prostate cancer Father 31       Social History:     Social History     Socioeconomic History   . Marital status: Married     Spouse name: Not on file   . Number of children: Not on file   . Years of education: Not on file   . Highest education level: Not on file   Occupational History   . Not on file   Social Needs   . Financial resource strain: Not on file   . Food insecurity     Worry: Not on file     Inability: Not on file   . Transportation needs     Medical: Not on file     Non-medical: Not on file   Tobacco Use   . Smoking status: Never Smoker   . Smokeless tobacco: Never Used   Substance and Sexual Activity   . Alcohol use: Yes     Alcohol/week: 2.0 standard drinks     Types: 1 Glasses of wine, 1 Cans of  beer per week     Comment: Once a month   . Drug use: No   . Sexual activity: Yes     Partners: Male     Birth control/protection: Post-menopausal   Lifestyle   . Physical activity     Days per week: Not on file     Minutes per session: Not on file   . Stress: Not on file   Relationships   . Social Wellsite geologist on phone: Not on file     Gets together: Not on file     Attends religious service: Not on file     Active member of club or organization: Not on file     Attends meetings of clubs or organizations: Not on file     Relationship status: Not on file   . Intimate partner violence  Fear of current or ex partner: Not on file     Emotionally abused: Not on file     Physically abused: Not on file     Forced sexual activity: Not on file   Other Topics Concern   . Not on file   Social History Narrative    She lives in [place], [place].  Retired.         REVIEW OF SYSTEMS REVIEWED:   YES  (x  )        HOME MEDICATIONS     Prior to Admission medications    Medication Sig Start Date End Date Taking? Authorizing Provider   Cholecalciferol (VITAMIN D3) 2000 units Tab Take 1 tablet by mouth every evening      Yes [provider]   simvastatin (ZOCOR) 10 MG tablet Take 10 mg by mouth nightly   Yes [provider]         INPATIENT MEDICATIONS      Current Facility-Administered Medications   Medication Dose Route Frequency     . sodium chloride 50 mL/hr at 05/17/19 0730           ALLERGIES:   No Known Allergies      PREVIOUS REACTION TO SEDATION MEDICATIONS     NO (   )   YES ( )      PHYSICAL EXAM     AIRWAY CLASSIFICATION:    CLASS I   (  )   CLASS II  ( x )    CLASS III  (  )     CLASS IV  (  )    INTUBATED (  )    CARDIAC :   ( x )  RRR  (  )  IRREG  (  )  MURMUR      LUNGS:   (x  )  CLEAR  (  )  DIMINISHED    (  ) LEFT   (  )  RIGHT  (  )  ABSENT          (  ) LEFT   (  )  RIGHT  (  )  TUBES            (  ) LEFT   (  )  RIGHT          ABDOMEN:   Soft, NT, ND    NEURO:   AAOx3    OTHER:    NA      LABS:     Lab Results   Component Value Date/Time    WBC 4.03 05/01/2019 01:29 PM    HCT 40.5 05/01/2019 01:29 PM    PLT 226 05/01/2019 01:29 PM    INR 1.1 06/20/2018 10:44 AM    PT 13.6 06/20/2018 10:44 AM    PTT 26 06/20/2018 10:44 AM    BUN 15.1 05/01/2019 01:29 PM    CREAT 0.9 05/01/2019 01:29 PM    GLU 89 05/01/2019 01:29 PM    K 4.6 05/01/2019 01:29 PM           ASA PHYSICAL STATUS   (  )  ASA 1   HEALTHY PATIENT  (  )  ASA 2   MILD SYSTEMIC ILLNESS  (x  )  ASA 3   SYSTEMIC DISEASE, NOT INCAPACITATING  (  )  ASA 4   SEVERE SYSTEMIC DISEASE, DISEASE IS CONSTANT THREAT TO LIFE  (  )  ASA 5  MORIBUND CONDITION, NOT EXPECTED TO LIVE >24 HOURS            IRRESPECTIVE OF PROCEDURE  (  )  E           EMERGENCY PROCEDURE       PLANNED SEDATION:   (  ) NO SEDATION  ( x ) MODERATE SEDATION  (  ) DEEP SEDATION WITH ANESTHESIA      CONCLUSION:   PATIENT HAS BEEN REASSESSED IMMEDIATELY PRIOR TO THE PROCEDURE   AND IS AN APPROPRIATE CANDIDATE FOR THE PLANNED SEDATION AND   PROCEDURE.  RISKS, BENEFITS AND ALTERNATIVES TO THE PLANNED   PROCEDURE AND SEDATION HAVE BEEN EXPLAINED TO THE PATIENT   OR GUARDIAN.    ( x )  YES  (  )  EMERGENCY CONSENT       Signed by: [facility]-Section of Vascular & Interventional Radiology  Contact Numbers:  Regular business hours (8A-5P M-F):  [facility]: [phone] (option 3-outpatient scheduling, option 4-consults, option 5-inpatient procedures)  [facility]: [phone]  [facility]: [phone]  After hours/Answering service: [phone]

## 2019-05-19 ENCOUNTER — Encounter: Payer: Self-pay | Admitting: Diagnostic Radiology

## 2020-01-26 ENCOUNTER — Other Ambulatory Visit: Payer: Self-pay | Admitting: Hematology & Oncology

## 2022-06-23 IMAGING — DX HIP BILATERAL WITH PELVIS 5 VIEWS
1 series · 5 of 5 positions shown · non-contrast
Comparison: None.

________________________________________________________________________________________________ 
HIP BILATERAL WITH PELVIS 5 VIEWS, SACRUM/COCCYX 2-3 VIEWS, 06/23/2022 [DATE]: 
CLINICAL INDICATION: Pain.

[Series 1: AP · U · 0.14mm/px · 5 of 5 slices shown]
[im 1/5]
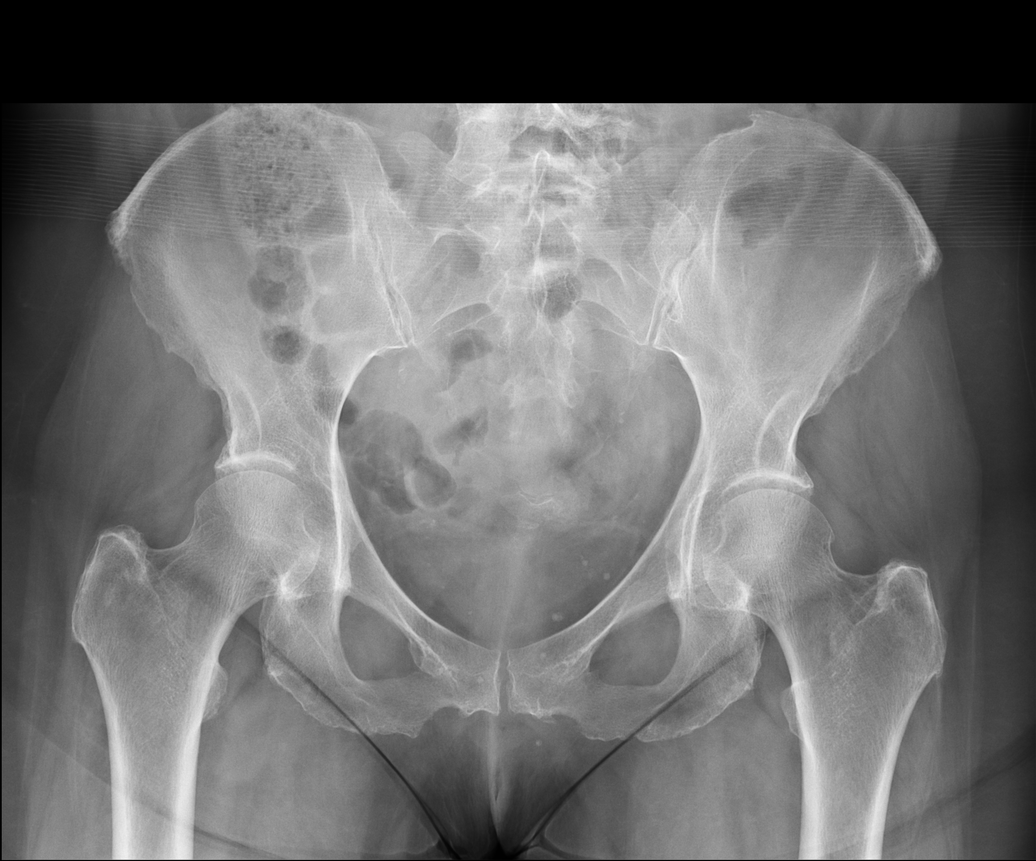
[im 2/5]
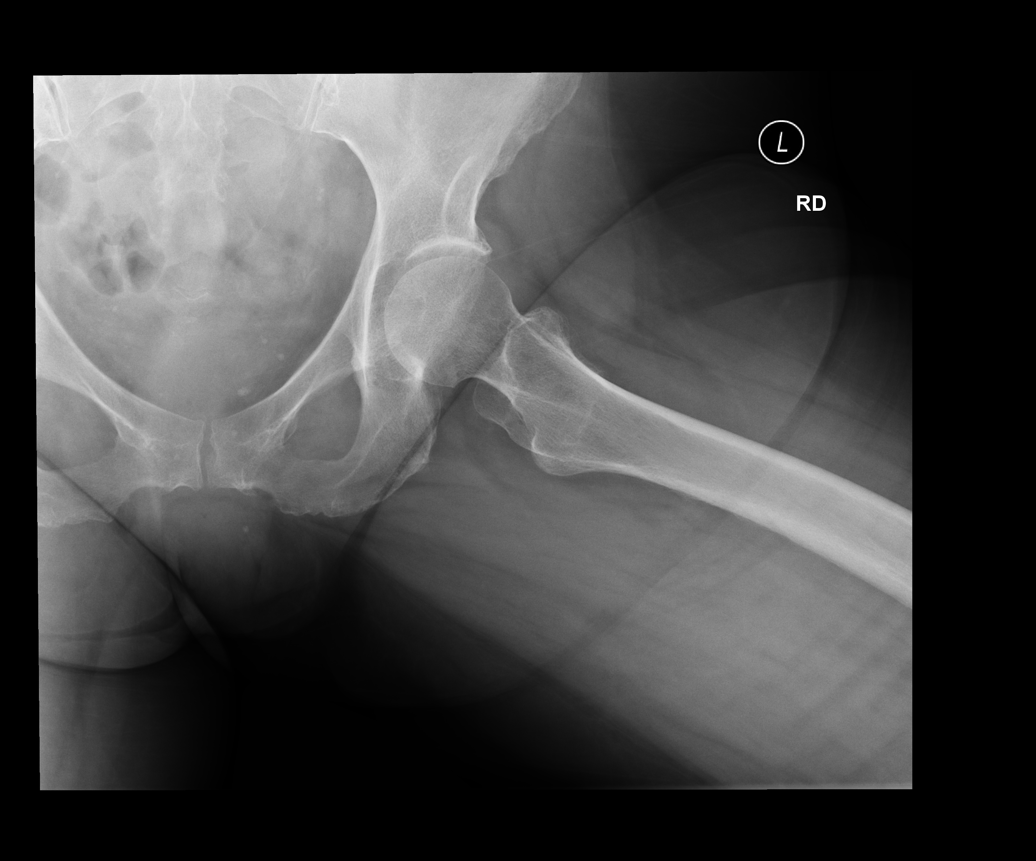
[im 3/5]
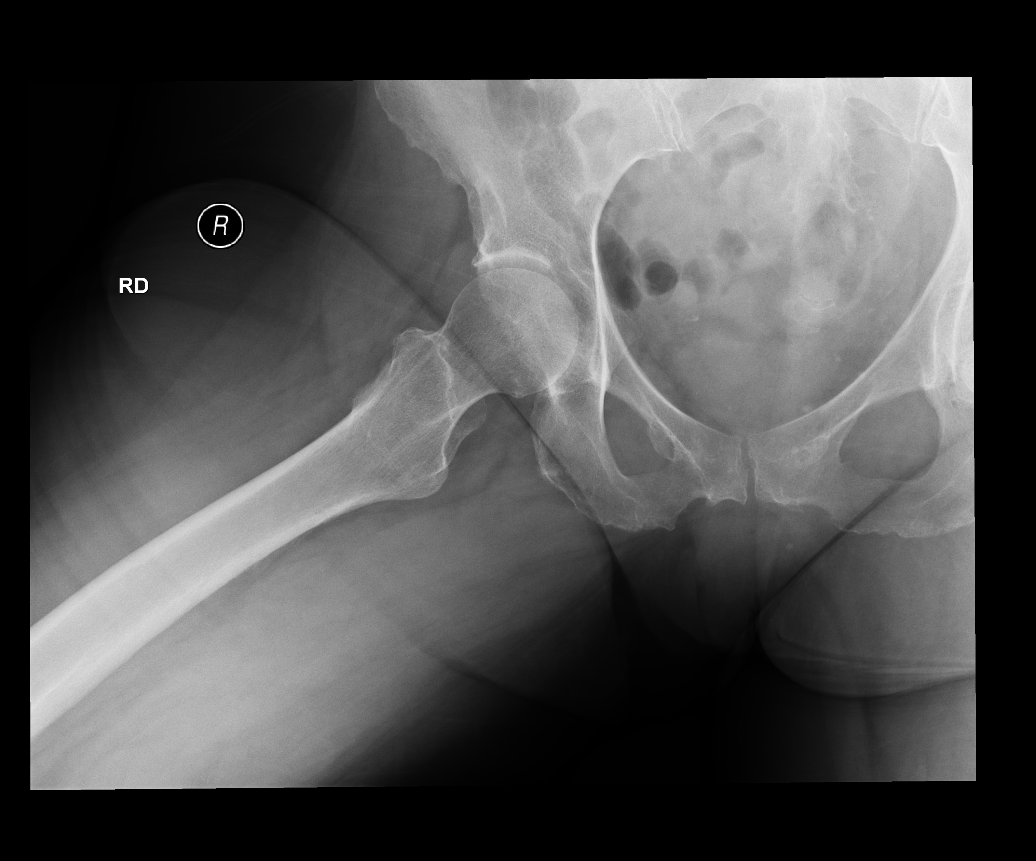
[im 4/5]
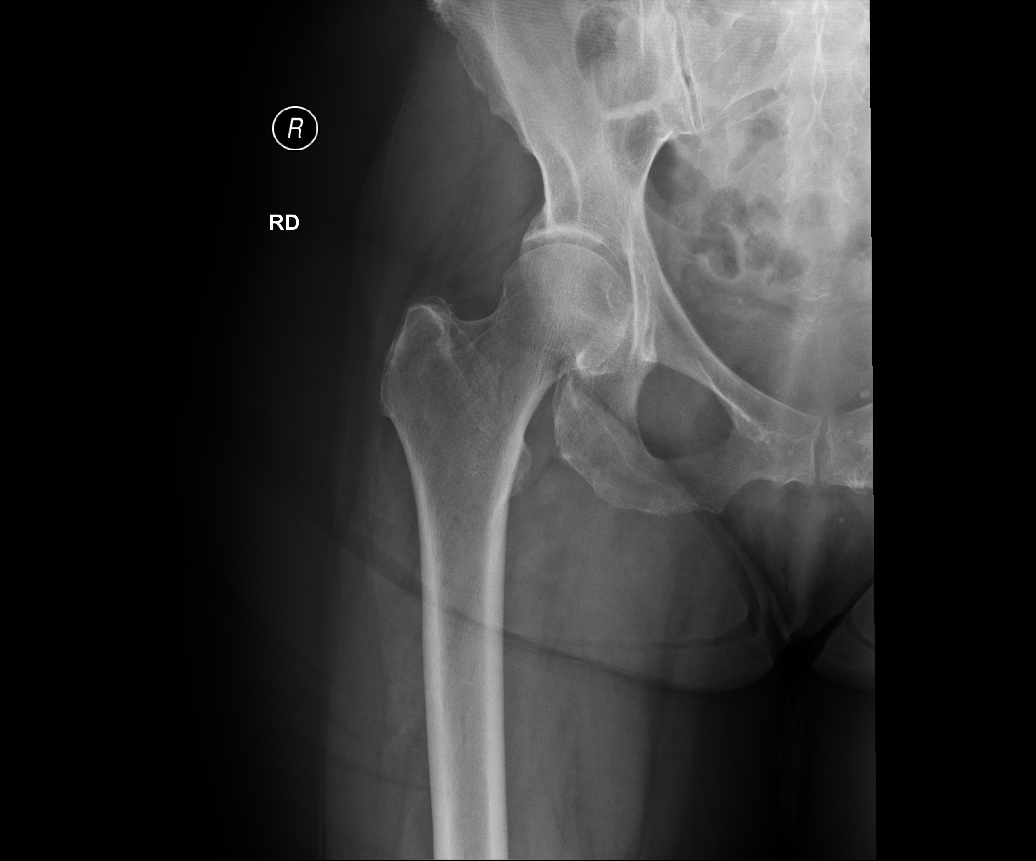
[im 5/5]
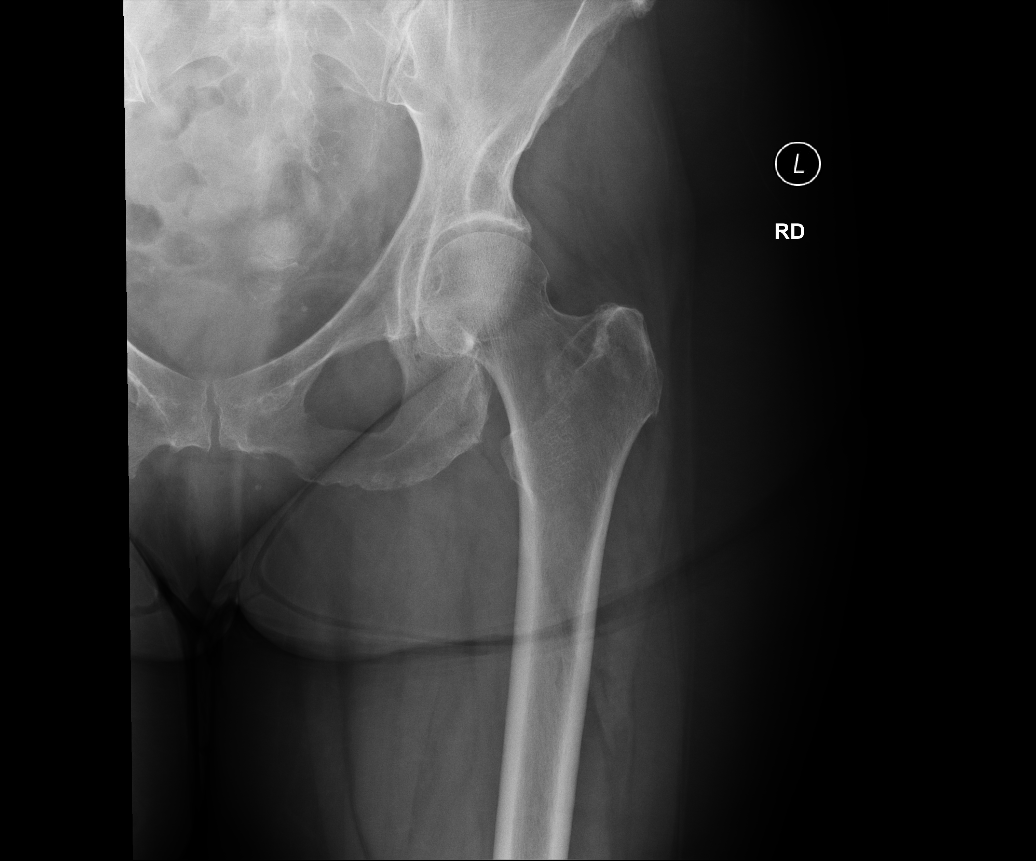

[5 of 5 positions shown; findings below may reference images not displayed]

FINDINGS: No fracture. Normal alignment. Hip joint spaces are preserved. SI 
joints are preserved. No erosion. No ankylosis. Sacral alar are intact. Normal 
sacrococcygeal alignment. Mild scattered vascular calcifications. Mild lower 
lumbar facet hypertrophy.
IMPRESSION: No acute osseous abnormality. If symptoms persist, consideration could be made 
for MR exams.

## 2022-06-23 IMAGING — DX SACRUM/COCCYX 2-3 VIEWS
1 series · 3 of 3 positions shown · non-contrast
Comparison: None.

________________________________________________________________________________________________ 
HIP BILATERAL WITH PELVIS 5 VIEWS, SACRUM/COCCYX 2-3 VIEWS, 06/23/2022 [DATE]: 
CLINICAL INDICATION: Pain.

[Series 1: AP · U · 0.14mm/px · 3 of 3 slices shown]
[im 1/3]
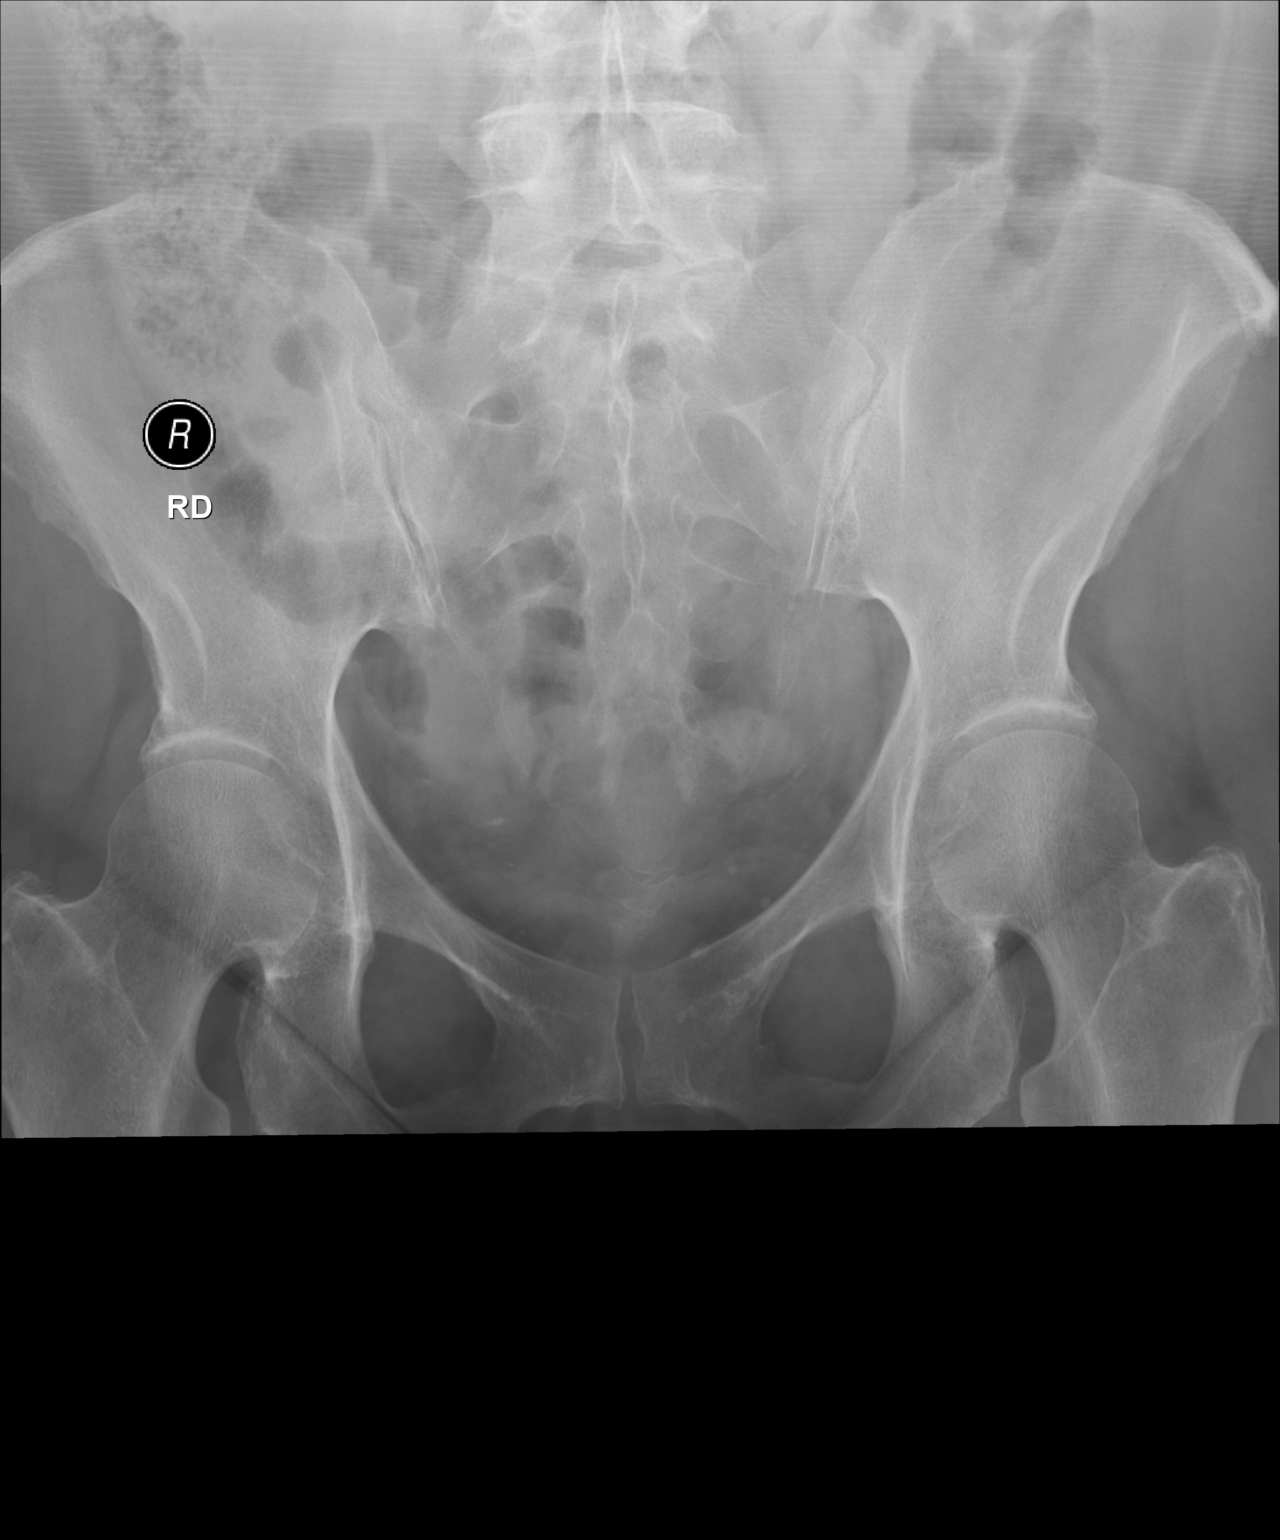
[im 2/3]
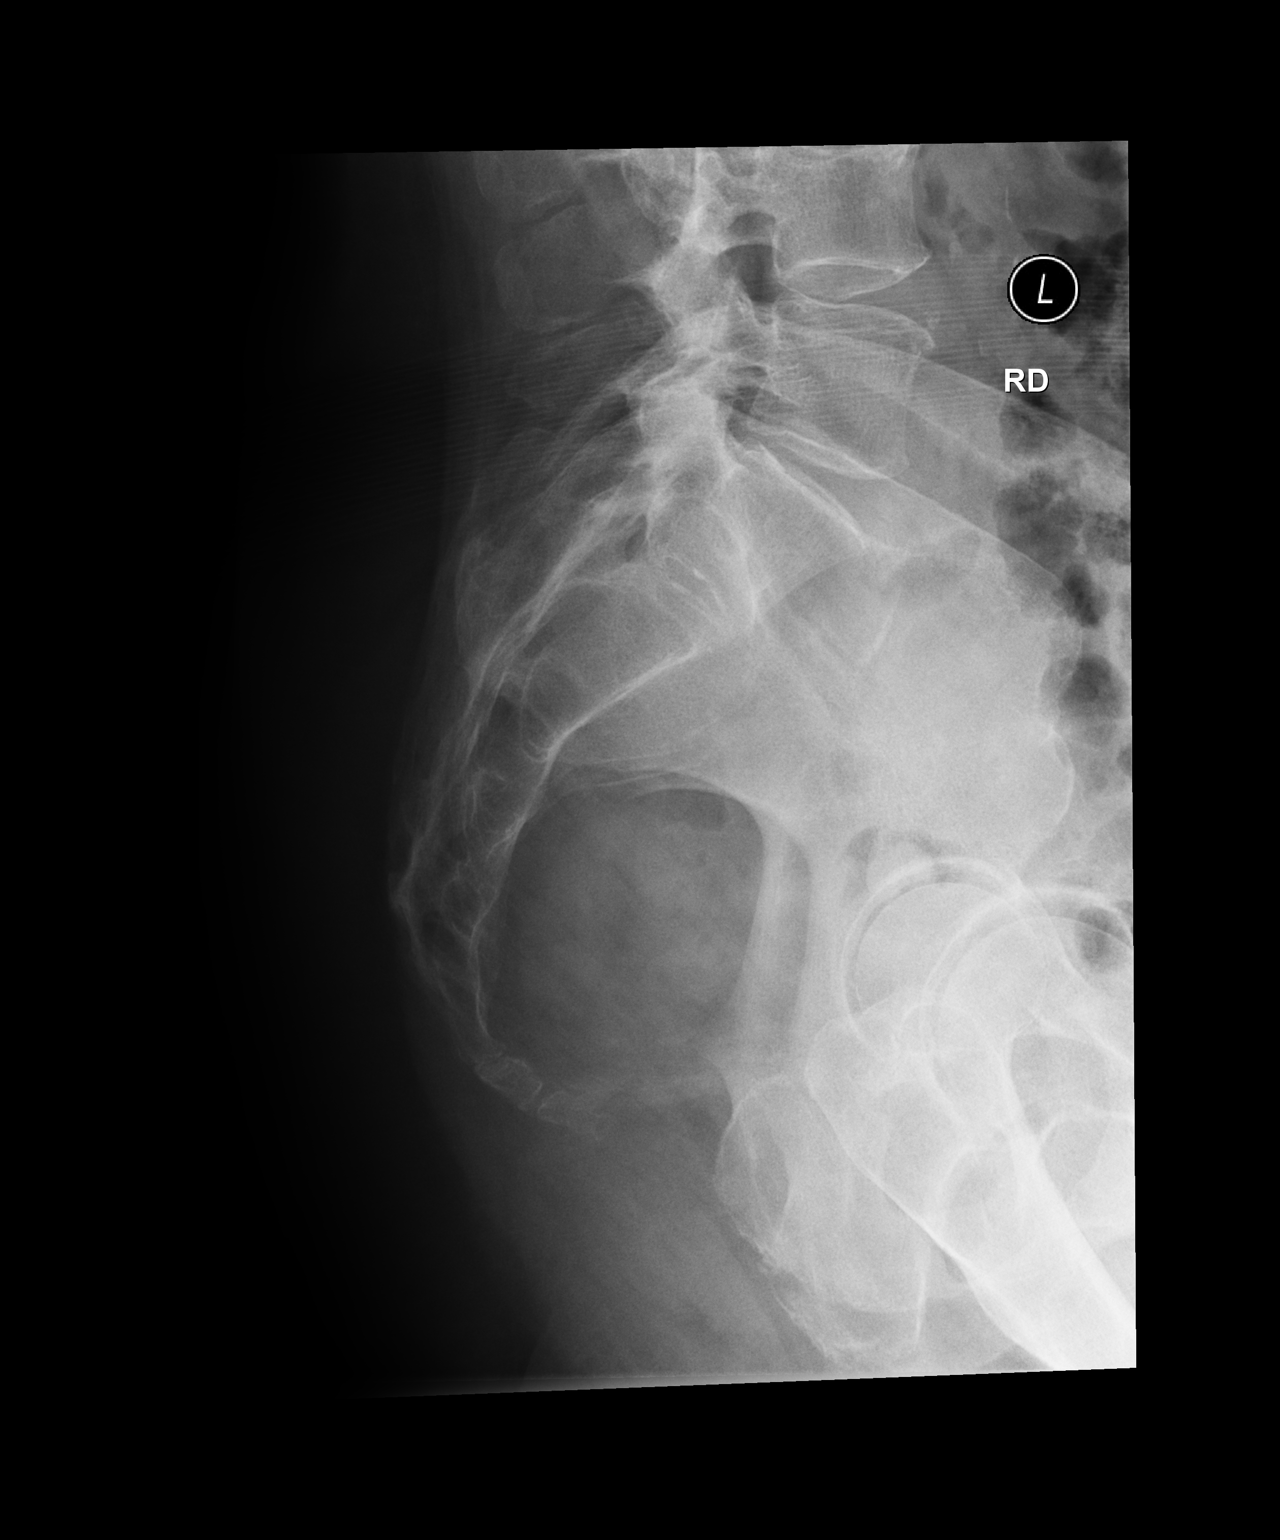
[im 3/3]
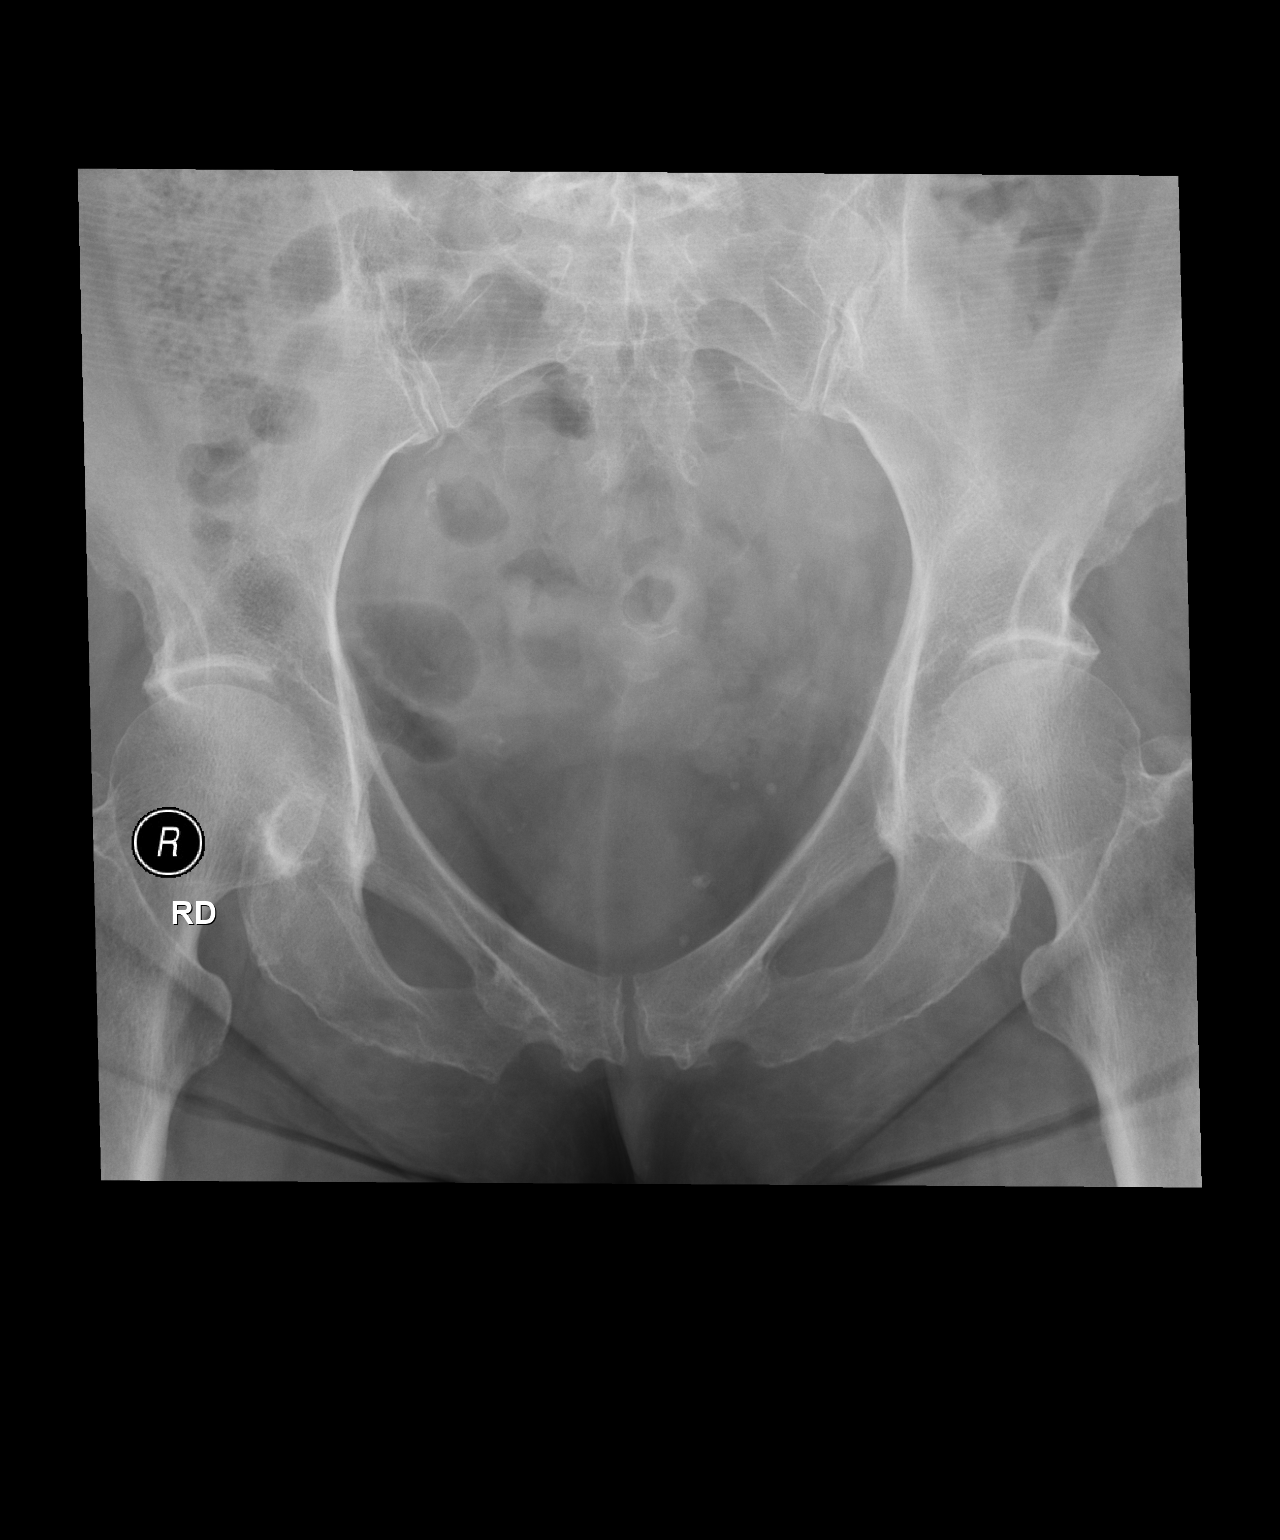

[3 of 3 positions shown; findings below may reference images not displayed]

FINDINGS: No fracture. Normal alignment. Hip joint spaces are preserved. SI 
joints are preserved. No erosion. No ankylosis. Sacral alar are intact. Normal 
sacrococcygeal alignment. Mild scattered vascular calcifications. Mild lower 
lumbar facet hypertrophy.
IMPRESSION: No acute osseous abnormality. If symptoms persist, consideration could be made 
for MR exams.

## 2022-10-12 IMAGING — MR MRI RIGHT HIP WITHOUT CONTRAST
4 of 6 series · 18 of 40 positions shown · IV contrast (gadolinium)
Comparison: 06/23/2022 radiographs

________________________________________________________________________________________________ 
MRI RIGHT HIP WITHOUT CONTRAST, MRI LEFT HIP WITHOUT CONTRAST, 10/12/2022 [DATE]: 
CLINICAL INDICATION: Primary osteoarthritis of the hips.
TECHNIQUE: Multiplanar, multiecho position MR images of the hip were performed 
without intravenous gadolinium enhancement. Large field-of-view images were 
performed of the pelvis to include the contralateral hip for comparison. Patient 
was scanned on a 3T magnet.

[Series 201: T1 · axial · 5.0mm · 0.62mm/px · z∈[-85,+129]mm · 3 of 44 slices shown]
[im 5/44]
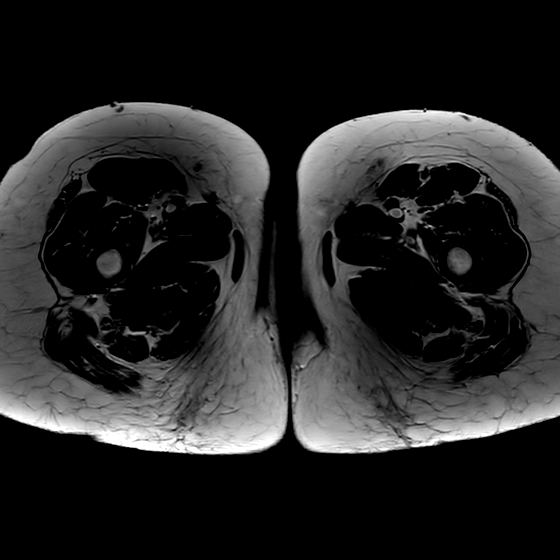
[im 24/44]
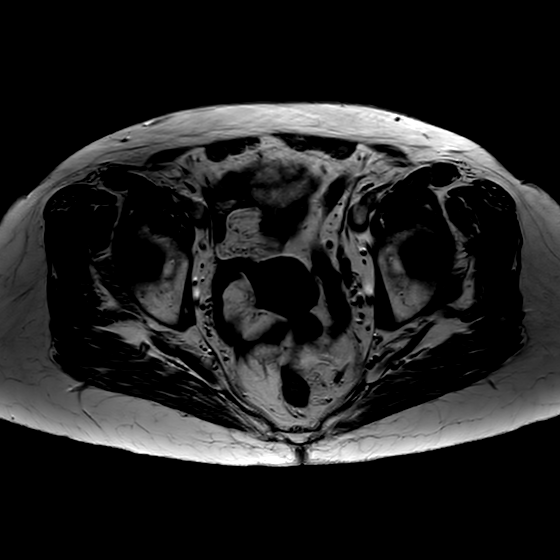
[im 39/44]
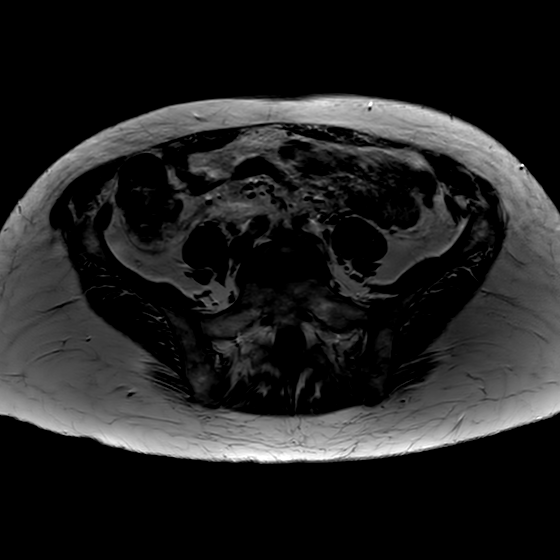

[Series 401: PD fat-sat · axial · 4.0mm · 0.35mm/px · z∈[-73,+69]mm · 7 of 32 slices shown (1 of 2)]
[im 1/32]
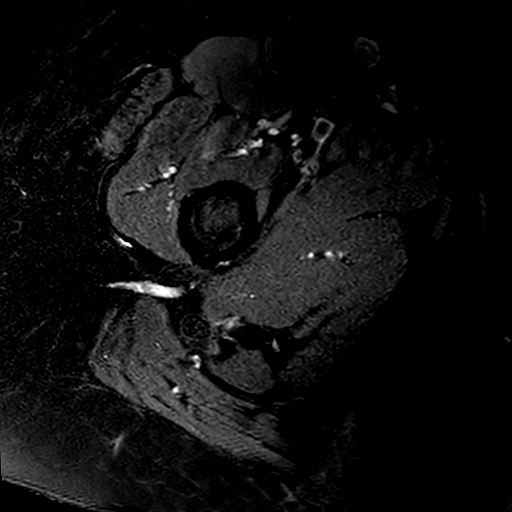
[im 6/32]
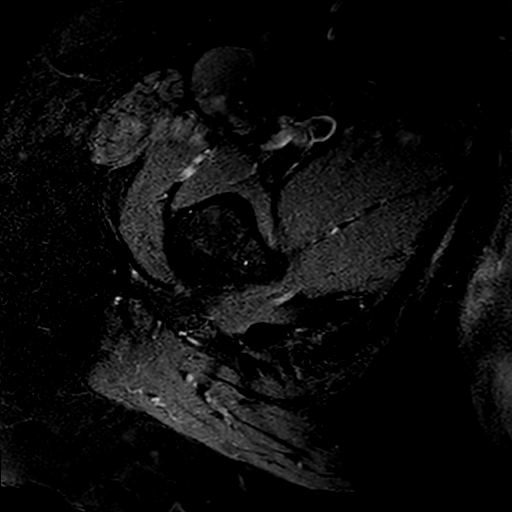
[im 11/32]
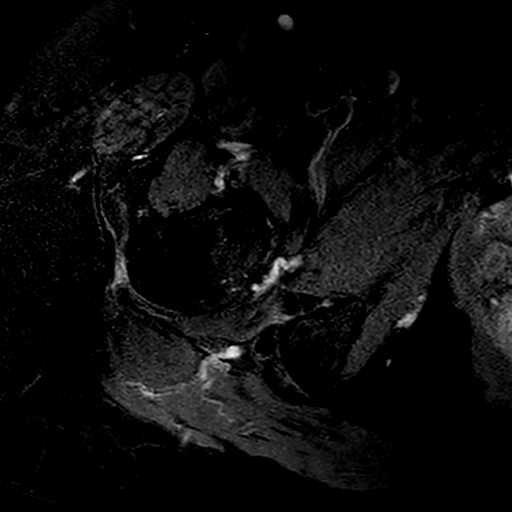
[im 16/32]
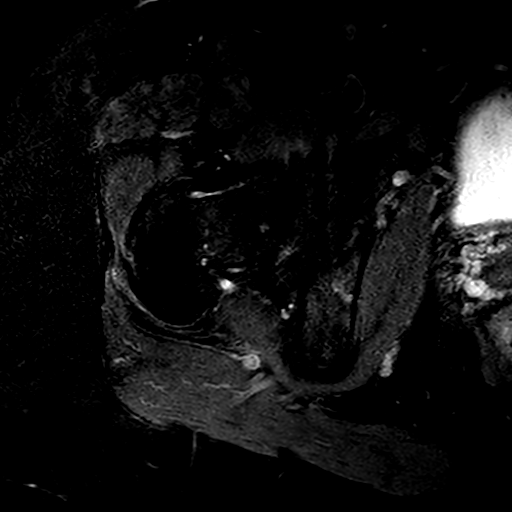
[im 21/32]
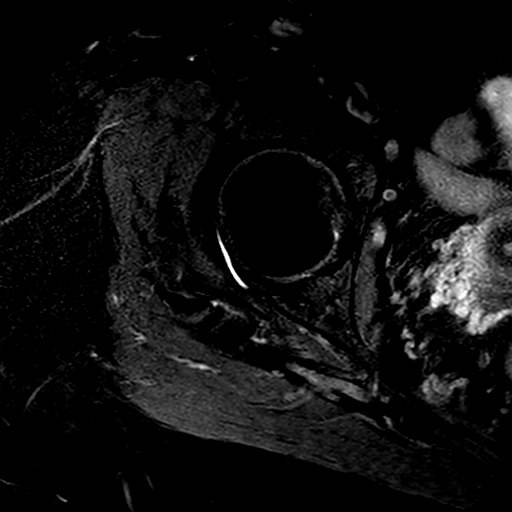
[im 26/32]
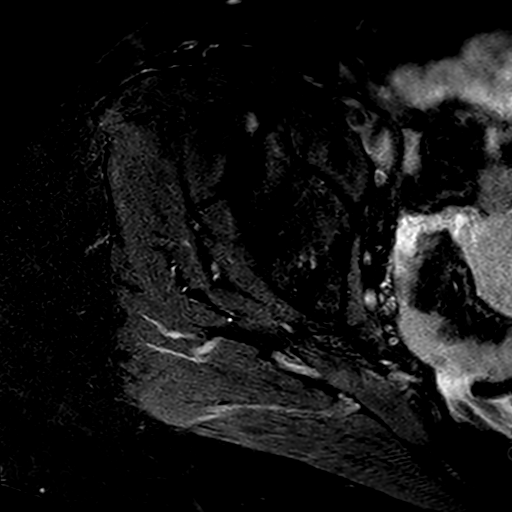
[im 32/32]
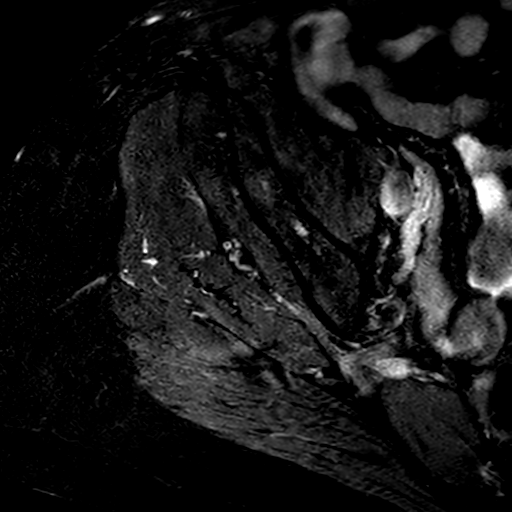

[Series 501: PD · sagittal · 3.5mm · 0.43mm/px · 3 of 36 slices shown]
[im 6/36]
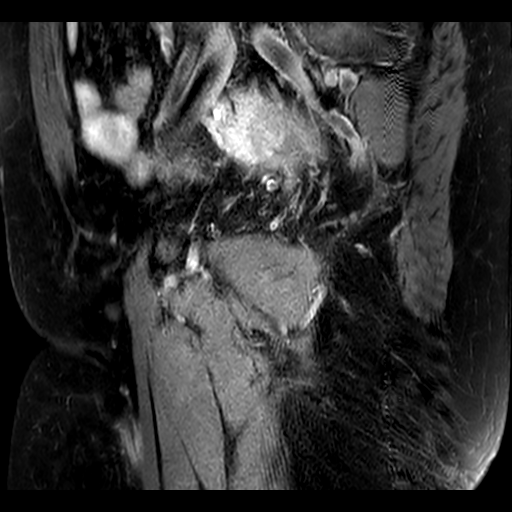
[im 21/36]
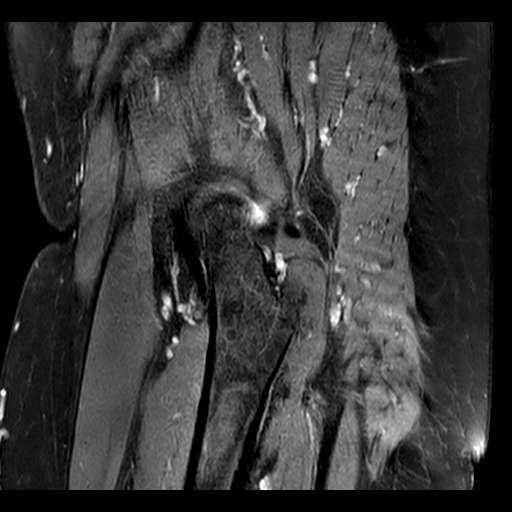
[im 31/36]
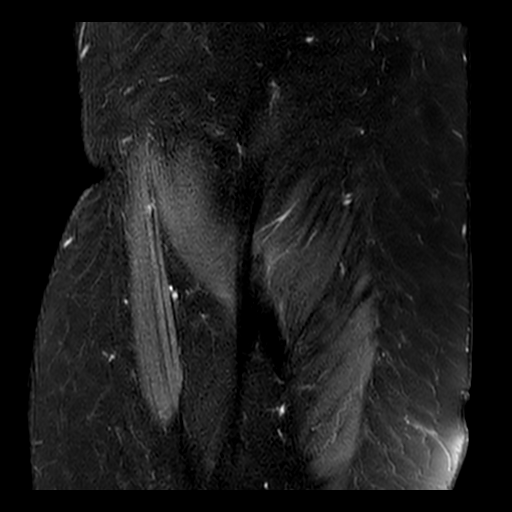

[Series 601: PD fat-sat · coronal · 3.2mm · 0.43mm/px · 5 of 20 slices shown (2 of 2)]
[im 1/20]
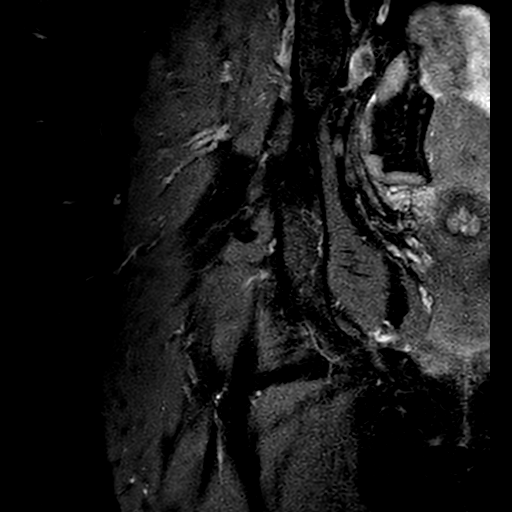
[im 5/20]
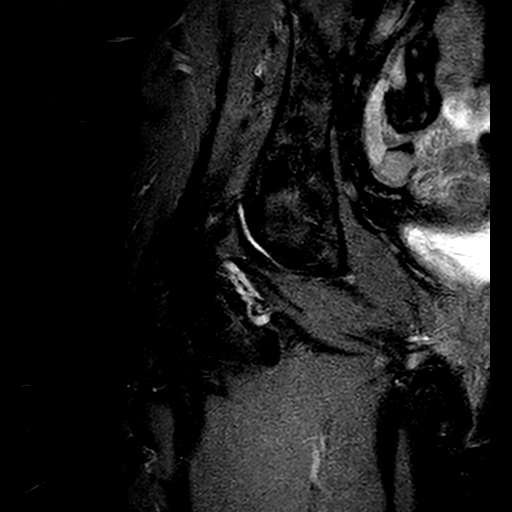
[im 10/20]
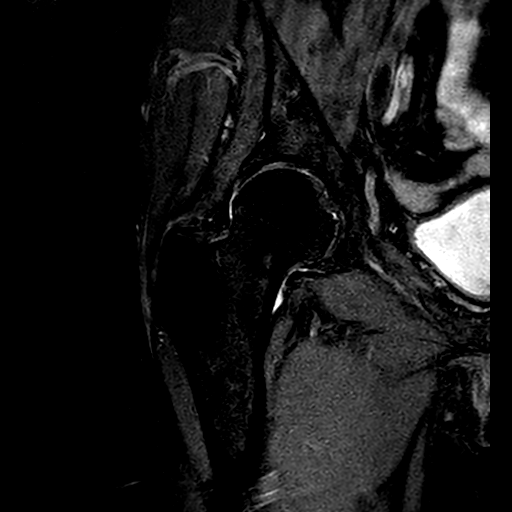
[im 15/20]
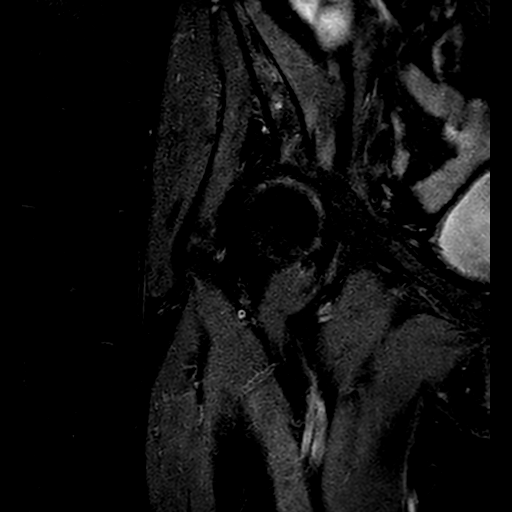
[im 20/20]
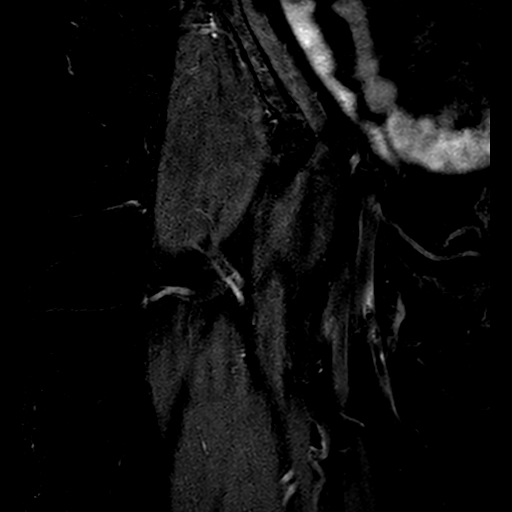

[18 of 40 positions shown; findings below may reference images not displayed]

FINDINGS: HIPS:  Mild degenerative change of the hips with partial thickness 
chondromalacia. Small bilateral degenerative labral tears. No paralabral cyst. 
No hip joint effusion. Both femoral heads maintain a spherical configuration 
without evidence of avascular necrosis or subarticular collapse. No abnormal 
morphology of the proximal femurs or acetabula to predispose to impingement.  
BONES: Normal marrow signal intensity of the proximal hips, pelvis, sacrum and 
included lower lumbar spine. No fracture, contusion or marrow replacing lesion. 
Degenerative change of the spine and SI joints, greater on the left. 
SOFT TISSUES: The bilateral abductor cuffs are preserved. The insertions of the 
iliopsoas tendons are intact. The origins of the hamstrings are preserved. 
Rectus abdominis-adductor complex is preserved. The musculature is symmetric 
without strain, atrophy or mass. No focal fluid collection or distended bursa. 
Specifically, no iliopsoas or trochanteric bursitis. Included neurovascular 
bundles are negative. Intrapelvic contents are negative.
IMPRESSION: 1.  Mild degenerative change of the hips and small bilateral degenerative labral 
tears.  
2.  Degenerative change of the spine and SI joints.

## 2022-10-12 IMAGING — MR MRI LEFT HIP WITHOUT CONTRAST
4 of 6 series · 18 of 40 positions shown · IV contrast (gadolinium)
Comparison: 06/23/2022 radiographs

________________________________________________________________________________________________ 
MRI RIGHT HIP WITHOUT CONTRAST, MRI LEFT HIP WITHOUT CONTRAST, 10/12/2022 [DATE]: 
CLINICAL INDICATION: Primary osteoarthritis of the hips.
TECHNIQUE: Multiplanar, multiecho position MR images of the hip were performed 
without intravenous gadolinium enhancement. Large field-of-view images were 
performed of the pelvis to include the contralateral hip for comparison. Patient 
was scanned on a 3T magnet.

[Series 201: T1 · axial · 5.0mm · 0.62mm/px · z∈[-119,+95]mm · 3 of 44 slices shown]
[im 5/44]
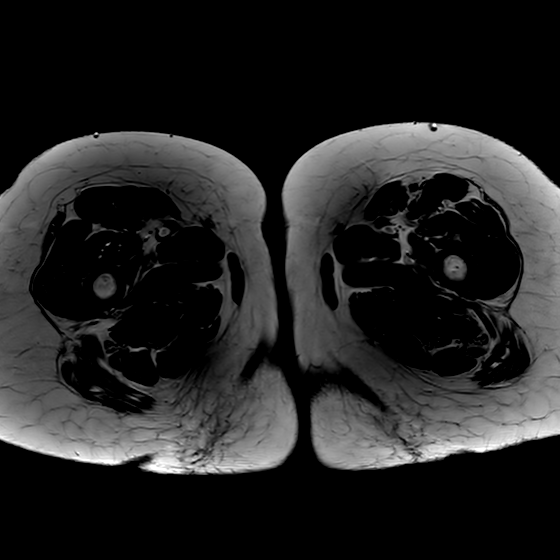
[im 24/44]
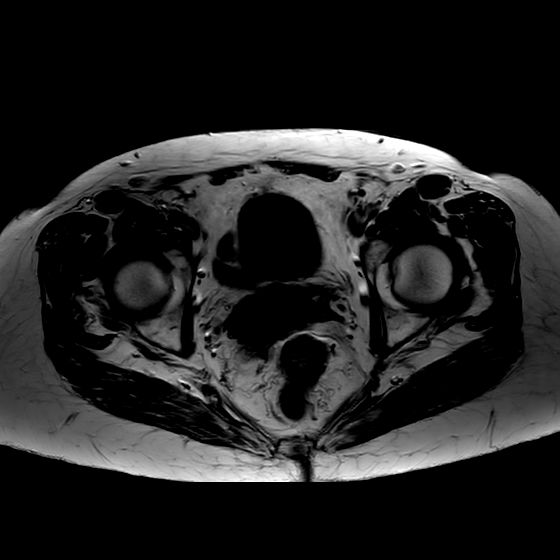
[im 39/44]
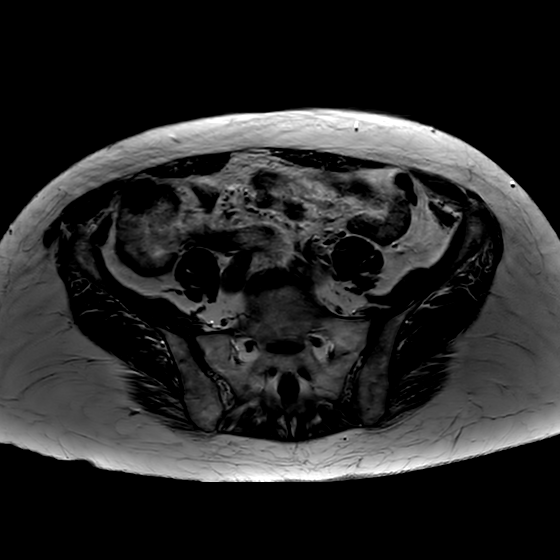

[Series 401: PD fat-sat · axial · 4.0mm · 0.35mm/px · z∈[-101,+41]mm · 7 of 32 slices shown (1 of 2)]
[im 1/32]
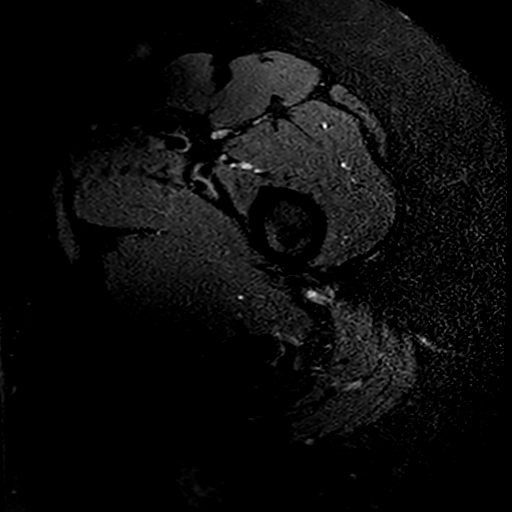
[im 6/32]
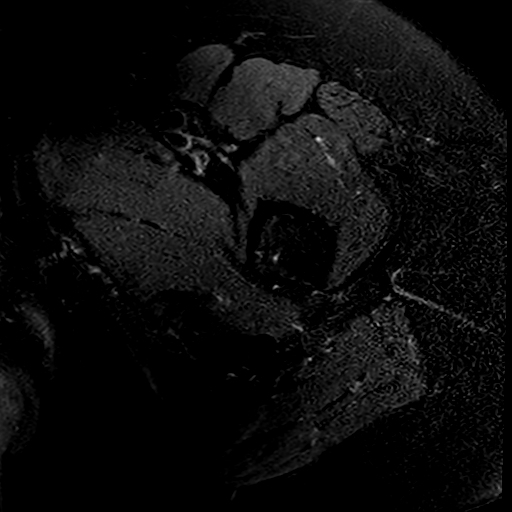
[im 11/32]
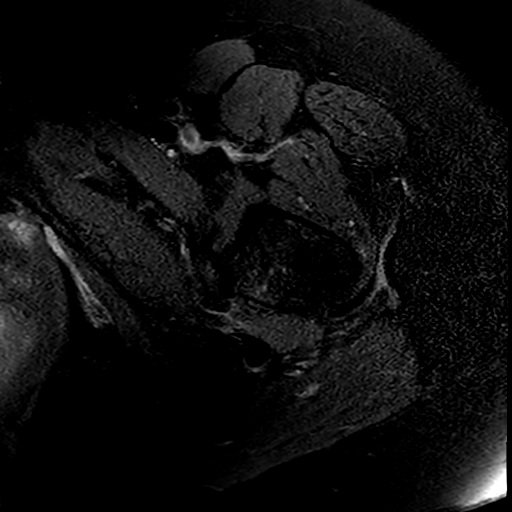
[im 16/32]
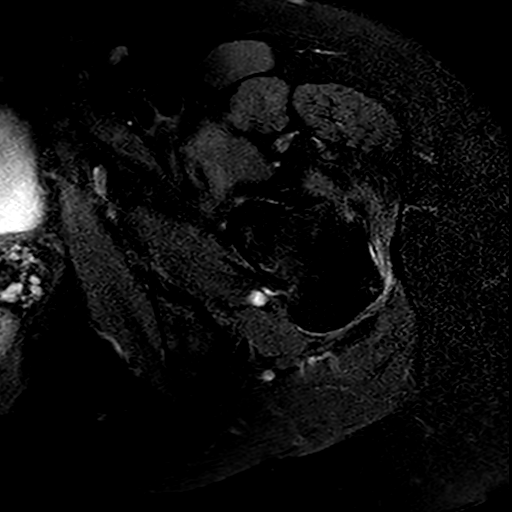
[im 21/32]
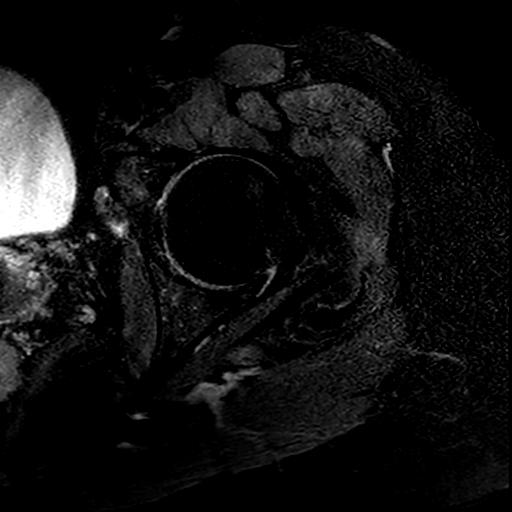
[im 26/32]
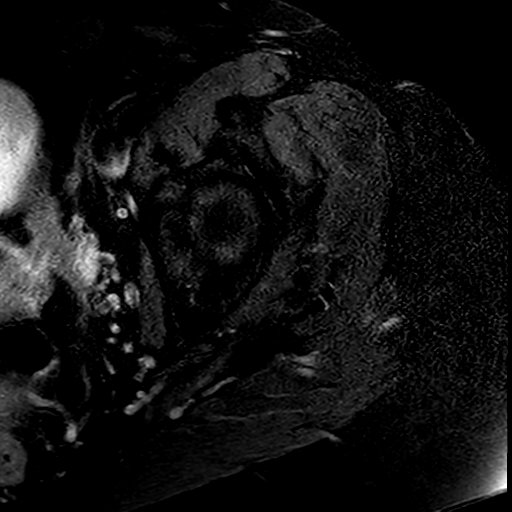
[im 32/32]
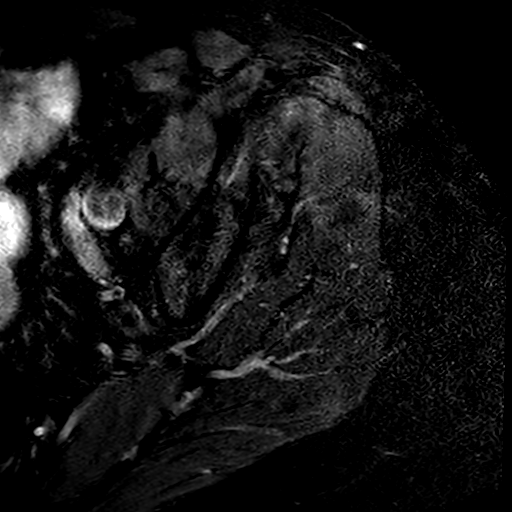

[Series 501: PD · sagittal · 3.5mm · 0.43mm/px · 3 of 36 slices shown]
[im 6/36]
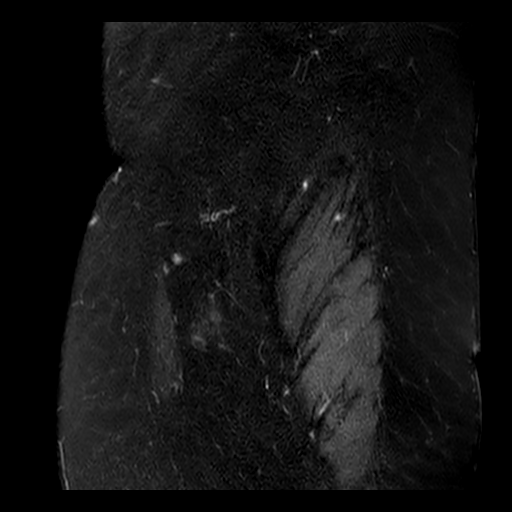
[im 21/36]
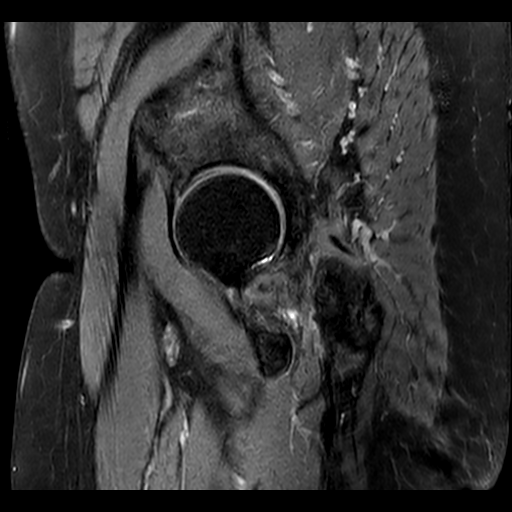
[im 31/36]
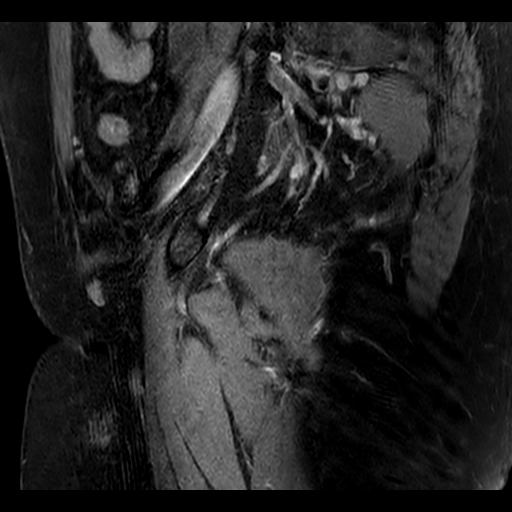

[Series 601: PD fat-sat · coronal · 3.2mm · 0.43mm/px · 5 of 20 slices shown (2 of 2)]
[im 1/20]
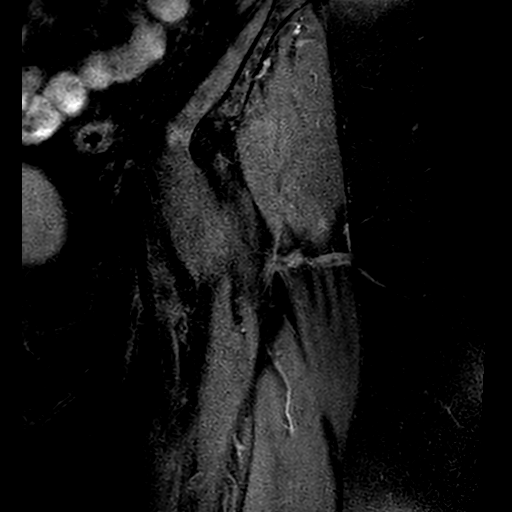
[im 5/20]
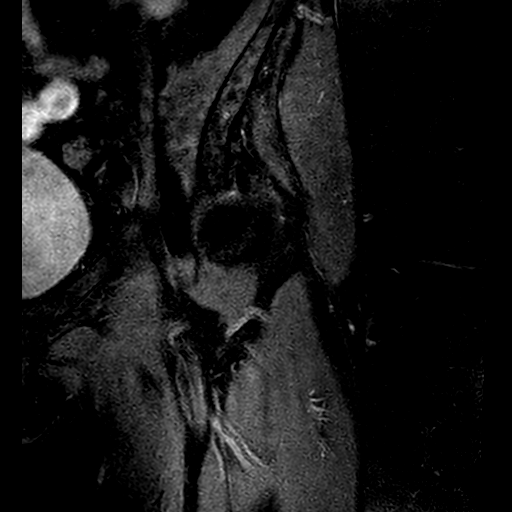
[im 10/20]
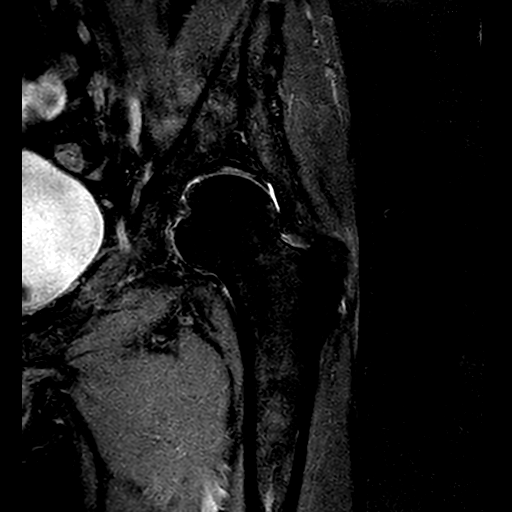
[im 15/20]
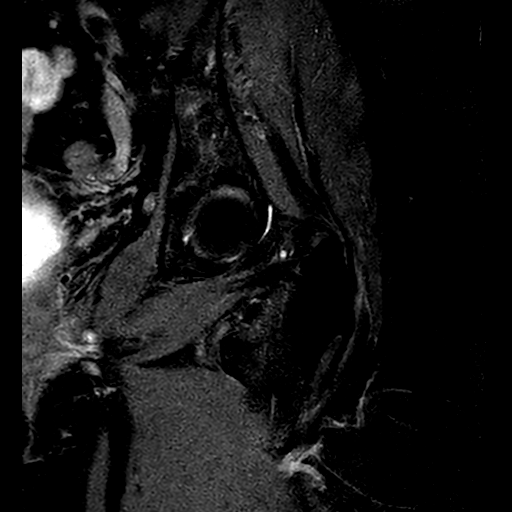
[im 20/20]
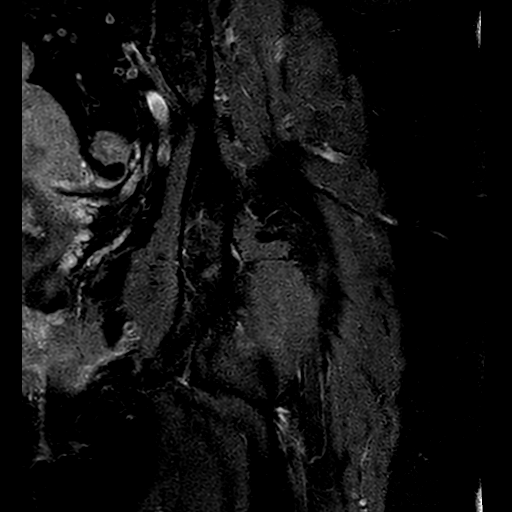

[18 of 40 positions shown; findings below may reference images not displayed]

FINDINGS: HIPS:  Mild degenerative change of the hips with partial thickness 
chondromalacia. Small bilateral degenerative labral tears. No paralabral cyst. 
No hip joint effusion. Both femoral heads maintain a spherical configuration 
without evidence of avascular necrosis or subarticular collapse. No abnormal 
morphology of the proximal femurs or acetabula to predispose to impingement.  
BONES: Normal marrow signal intensity of the proximal hips, pelvis, sacrum and 
included lower lumbar spine. No fracture, contusion or marrow replacing lesion. 
Degenerative change of the spine and SI joints, greater on the left. 
SOFT TISSUES: The bilateral abductor cuffs are preserved. The insertions of the 
iliopsoas tendons are intact. The origins of the hamstrings are preserved. 
Rectus abdominis-adductor complex is preserved. The musculature is symmetric 
without strain, atrophy or mass. No focal fluid collection or distended bursa. 
Specifically, no iliopsoas or trochanteric bursitis. Included neurovascular 
bundles are negative. Intrapelvic contents are negative.
IMPRESSION: 1.  Mild degenerative change of the hips and small bilateral degenerative labral 
tears.  
2.  Degenerative change of the spine and SI joints.
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
## 2022-10-16 IMAGING — MR MRI LUMBAR SPINE WITHOUT CONTRAST
6 of 9 series · 15 of 48 positions shown · IV contrast (gadolinium)
Comparison: No prior lumbar examination

________________________________________________________________________________________________ 
MRI LUMBAR SPINE WITHOUT CONTRAST, 10/16/2022 [DATE]: 
CLINICAL INDICATION: Radiculopathy lumbar region, chronic low back with 
bilateral hip pain, osteoarthritis
TECHNIQUE: Sagittal T1, Sagittal T2, Sagittal STIR, Axial T1 and Axial T2 MR 
images of the lumbar spine were performed without intravenous gadolinium 
enhancement.

[Series 101: survey · axial · 10.0mm · 1.39mm/px · 1 of 9 slices shown]
[im 1/9]
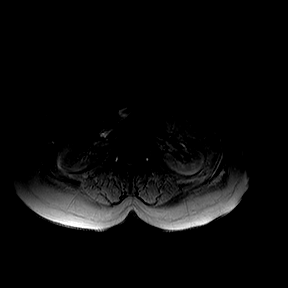

[Series 201: t2w_cor-surv · coronal · 6.0mm · 0.50mm/px · 1 of 5 slices shown]
[im 1/5]
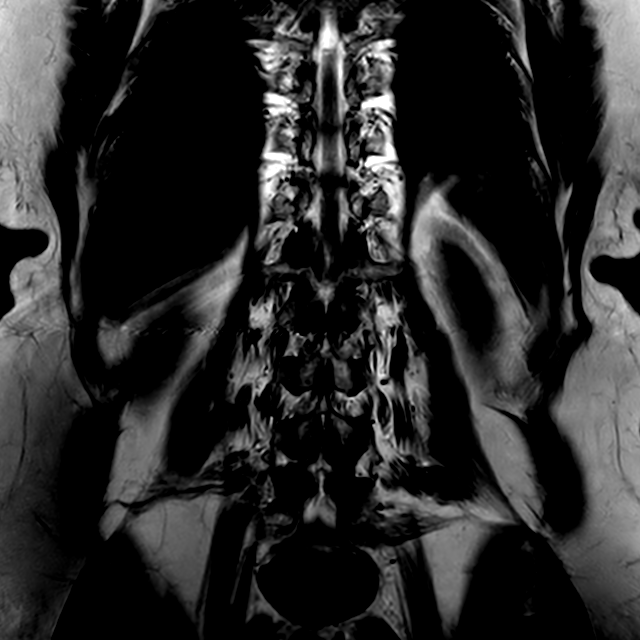

[Series 301: t1w_tse sag · sagittal · 4.0mm · 0.26mm/px · 1 of 17 slices shown]
[im 1/17]
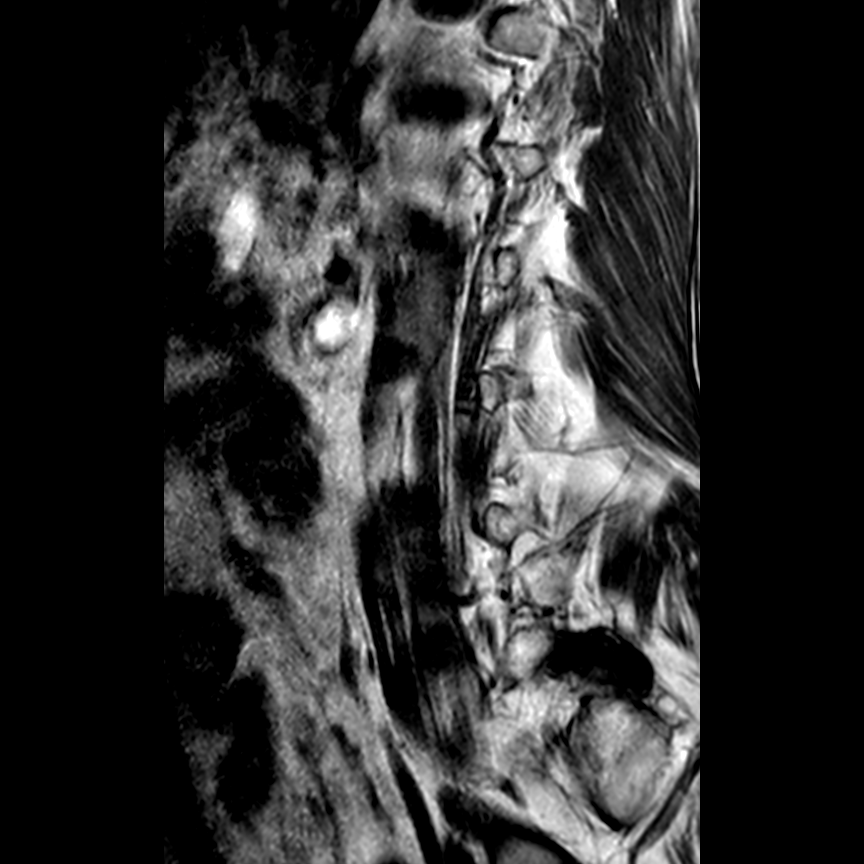

[Series 402: sst2 sag/fs · sagittal · 4.0mm · 0.47mm/px · 2 of 17 slices shown]
[im 1/17]
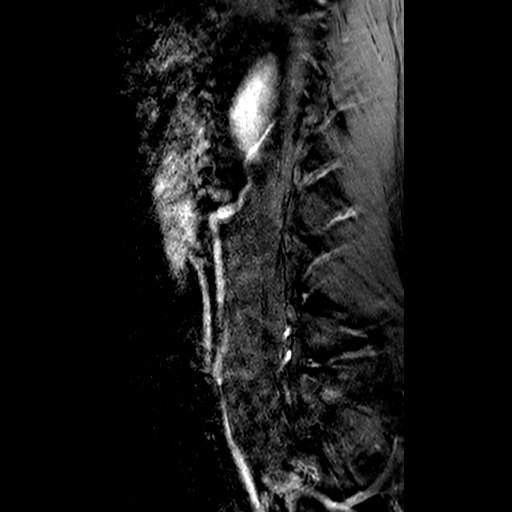
[im 17/17]
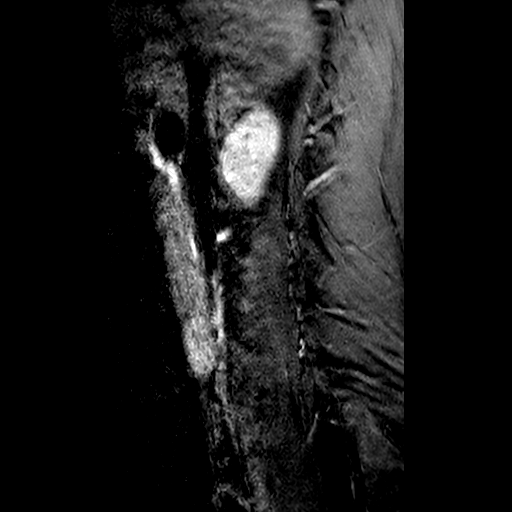

[Series 403: st2 sag · sagittal · 4.0mm · 0.47mm/px · 2 of 17 slices shown]
[im 1/17]
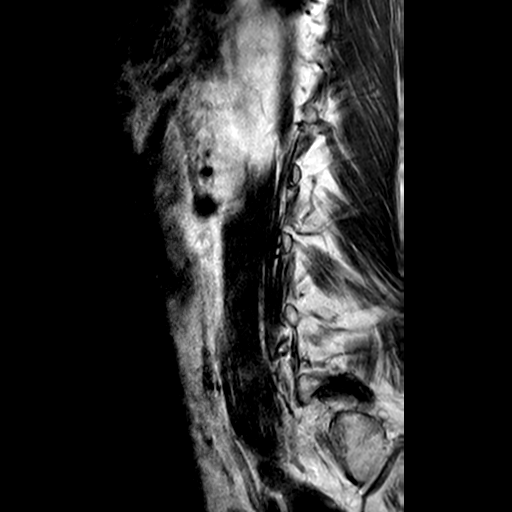
[im 17/17]
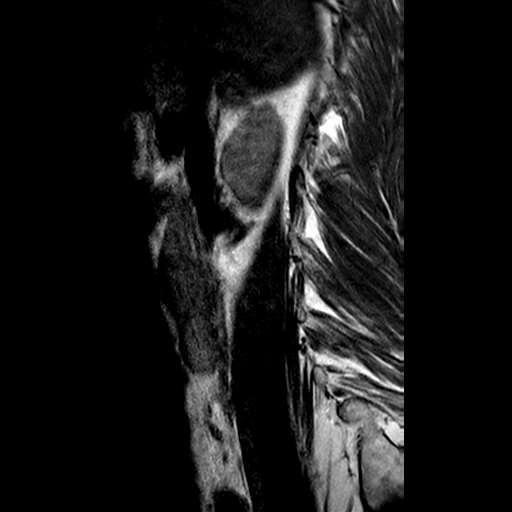

[Series 501: 3d_spine_view_t2w sag · sagittal · 1.5mm · 0.47mm/px · 8 of 107 slices shown]
[im 1/107]
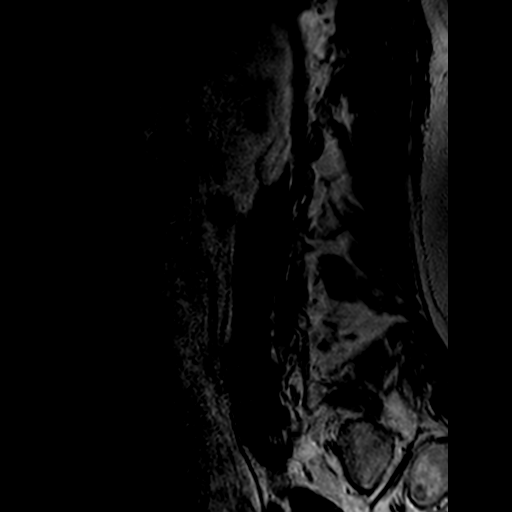
[im 18/107]
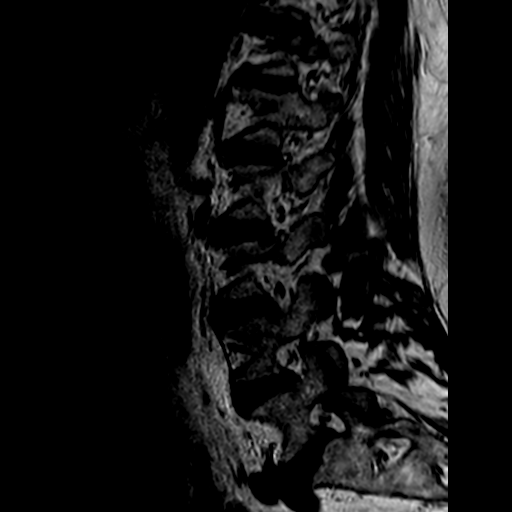
[im 36/107]
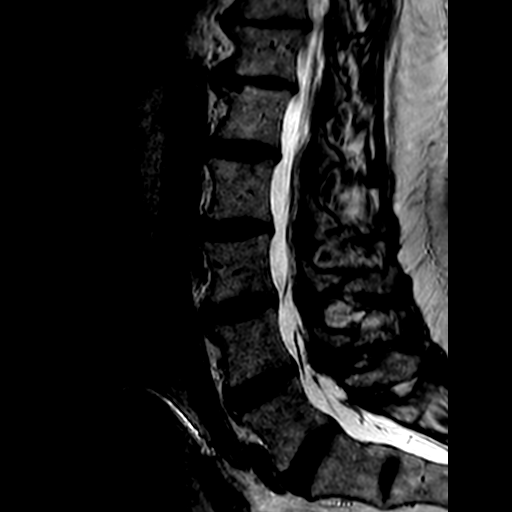
[im 45/107]
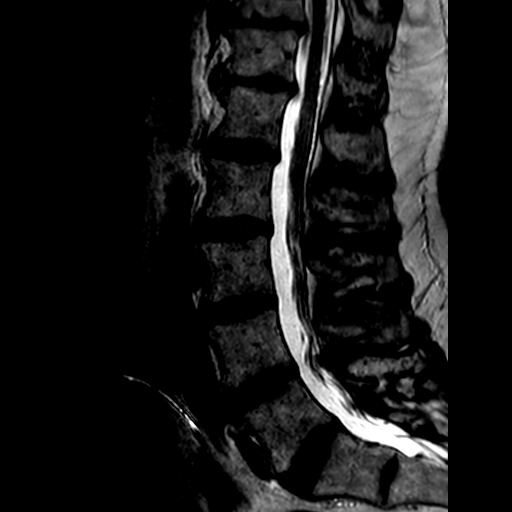
[im 62/107]
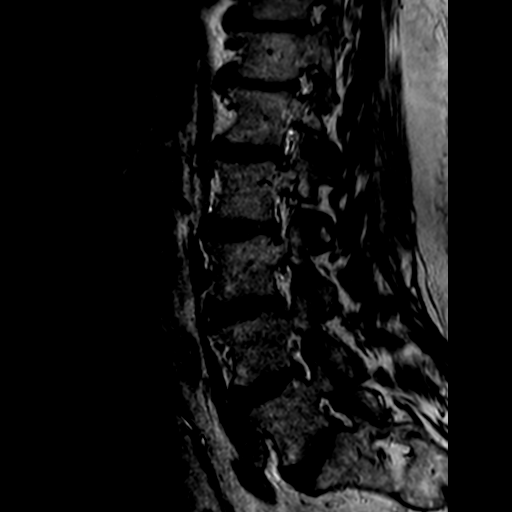
[im 71/107]
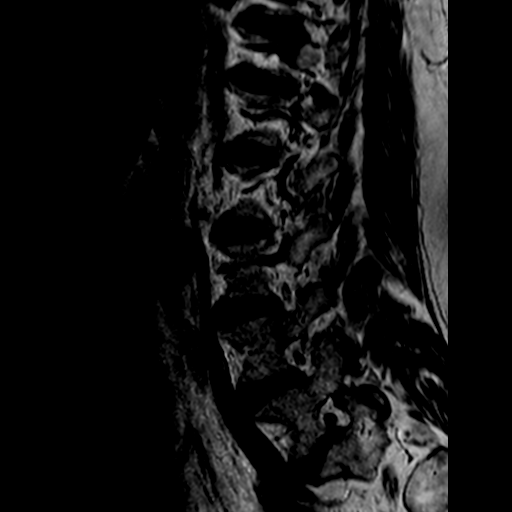
[im 89/107]
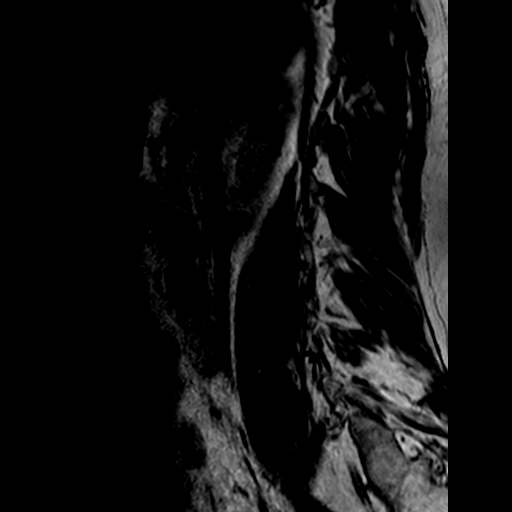
[im 107/107]
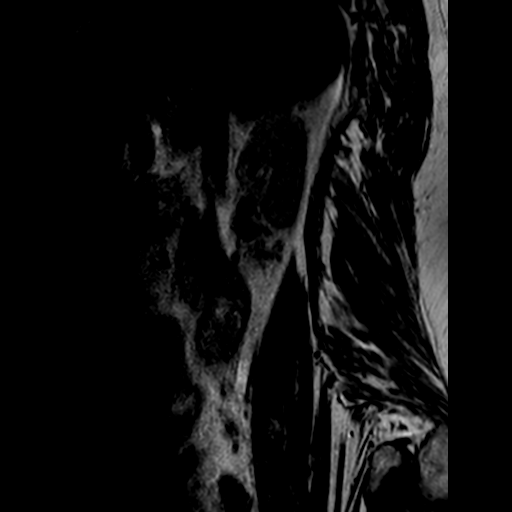

[15 of 48 positions shown; findings below may reference images not displayed]

FINDINGS: Lumbar vertebral heights are intact. There is marked disc narrowing at 
T11-12 and T12-L1. Lumbar disc heights are preserved. Lower cord and conus show 
normal signal, conus terminating opposite L1-2. There is mild motion artifact 
limiting detail. 
There is minimal Modic type I changes at L5-S1. There is no evidence for 
compression fracture or spinal malignancy. There is mild reactive edema 
involving the L4-5 and L5-S1 facet joints bilaterally. 
At L5-S1 the canal is open. There is moderate to marked facet degenerative 
change. Mild right foraminal stenosis, left foramen open. 
At L4-5 the canal is open. There is minimal broad-based disc bulge. There is 
moderate to marked facet degenerative change. Mild right foraminal stenosis, 
left foramen open. 
At L3-4 the canal and foramina are open. Moderate facet degenerative change. 
The upper 2 lumbar levels show no significant stenosis. 
There is disc bulging at T11-12 and T12-L1 without significant stenosis. No 
extrinsic cord deformity with the patient supine. Foramina appear open.
IMPRESSION: Degenerative changes as described, most pronounced in the lower lumbar facet 
joints. There is mild reactive edema involving the L4-5 and L5-S1 facet joints . 
Potentially, facet steroid injection could be of benefit. 
There is no significant lumbar canal or foraminal stenosis. 
No evidence for fracture or spinal malignancy.
